# Patient Record
Sex: Female | Born: 1960 | Race: Black or African American | Hispanic: No | Marital: Single | Smoking: Never smoker
Health system: Southern US, Community
[De-identification: ages and names within clinical notes are randomized; demographics above are authoritative.]

## PROBLEM LIST (undated history)

## (undated) DIAGNOSIS — I509 Heart failure, unspecified: Secondary | ICD-10-CM

## (undated) DIAGNOSIS — K219 Gastro-esophageal reflux disease without esophagitis: Secondary | ICD-10-CM

## (undated) DIAGNOSIS — F259 Schizoaffective disorder, unspecified: Secondary | ICD-10-CM

## (undated) DIAGNOSIS — M069 Rheumatoid arthritis, unspecified: Secondary | ICD-10-CM

## (undated) DIAGNOSIS — I10 Essential (primary) hypertension: Secondary | ICD-10-CM

## (undated) DIAGNOSIS — J449 Chronic obstructive pulmonary disease, unspecified: Secondary | ICD-10-CM

## (undated) DIAGNOSIS — M109 Gout, unspecified: Secondary | ICD-10-CM

## (undated) DIAGNOSIS — E785 Hyperlipidemia, unspecified: Secondary | ICD-10-CM

## (undated) DIAGNOSIS — E119 Type 2 diabetes mellitus without complications: Secondary | ICD-10-CM

---

## 2006-08-11 ENCOUNTER — Emergency Department: Payer: Self-pay

## 2006-11-25 ENCOUNTER — Emergency Department: Payer: Self-pay | Admitting: General Practice

## 2006-12-12 ENCOUNTER — Ambulatory Visit: Payer: Self-pay | Admitting: Orthopedic Surgery

## 2006-12-19 ENCOUNTER — Ambulatory Visit: Payer: Self-pay | Admitting: Orthopedic Surgery

## 2007-04-03 ENCOUNTER — Ambulatory Visit: Payer: Self-pay | Admitting: Internal Medicine

## 2007-11-27 ENCOUNTER — Emergency Department: Payer: Self-pay | Admitting: Emergency Medicine

## 2008-05-24 ENCOUNTER — Inpatient Hospital Stay: Payer: Self-pay | Admitting: Psychiatry

## 2009-03-10 ENCOUNTER — Ambulatory Visit: Payer: Self-pay | Admitting: Internal Medicine

## 2009-03-27 ENCOUNTER — Ambulatory Visit: Payer: Self-pay | Admitting: Internal Medicine

## 2009-05-12 ENCOUNTER — Ambulatory Visit: Payer: Self-pay | Admitting: Endocrinology

## 2009-05-22 ENCOUNTER — Ambulatory Visit: Payer: Self-pay | Admitting: Internal Medicine

## 2009-07-29 ENCOUNTER — Ambulatory Visit: Payer: Self-pay | Admitting: Internal Medicine

## 2010-09-17 IMAGING — CR DG CHEST 1V PORT
1 series · 1 of 1 positions shown · non-contrast
Comparison: none

REASON FOR EXAM: Post Bronch
COMMENTS:

PROCEDURE:     DXR - DXR PORTABLE CHEST SINGLE VIEW  - May 22, 2009  [DATE]
RESULT:     Comparison is made to the study of 03/10/2009.
There is shallow inspiration. Cardiac monitoring electrodes are present. No
pneumothorax or pneumomediastinum is appreciated.

[view not recorded]
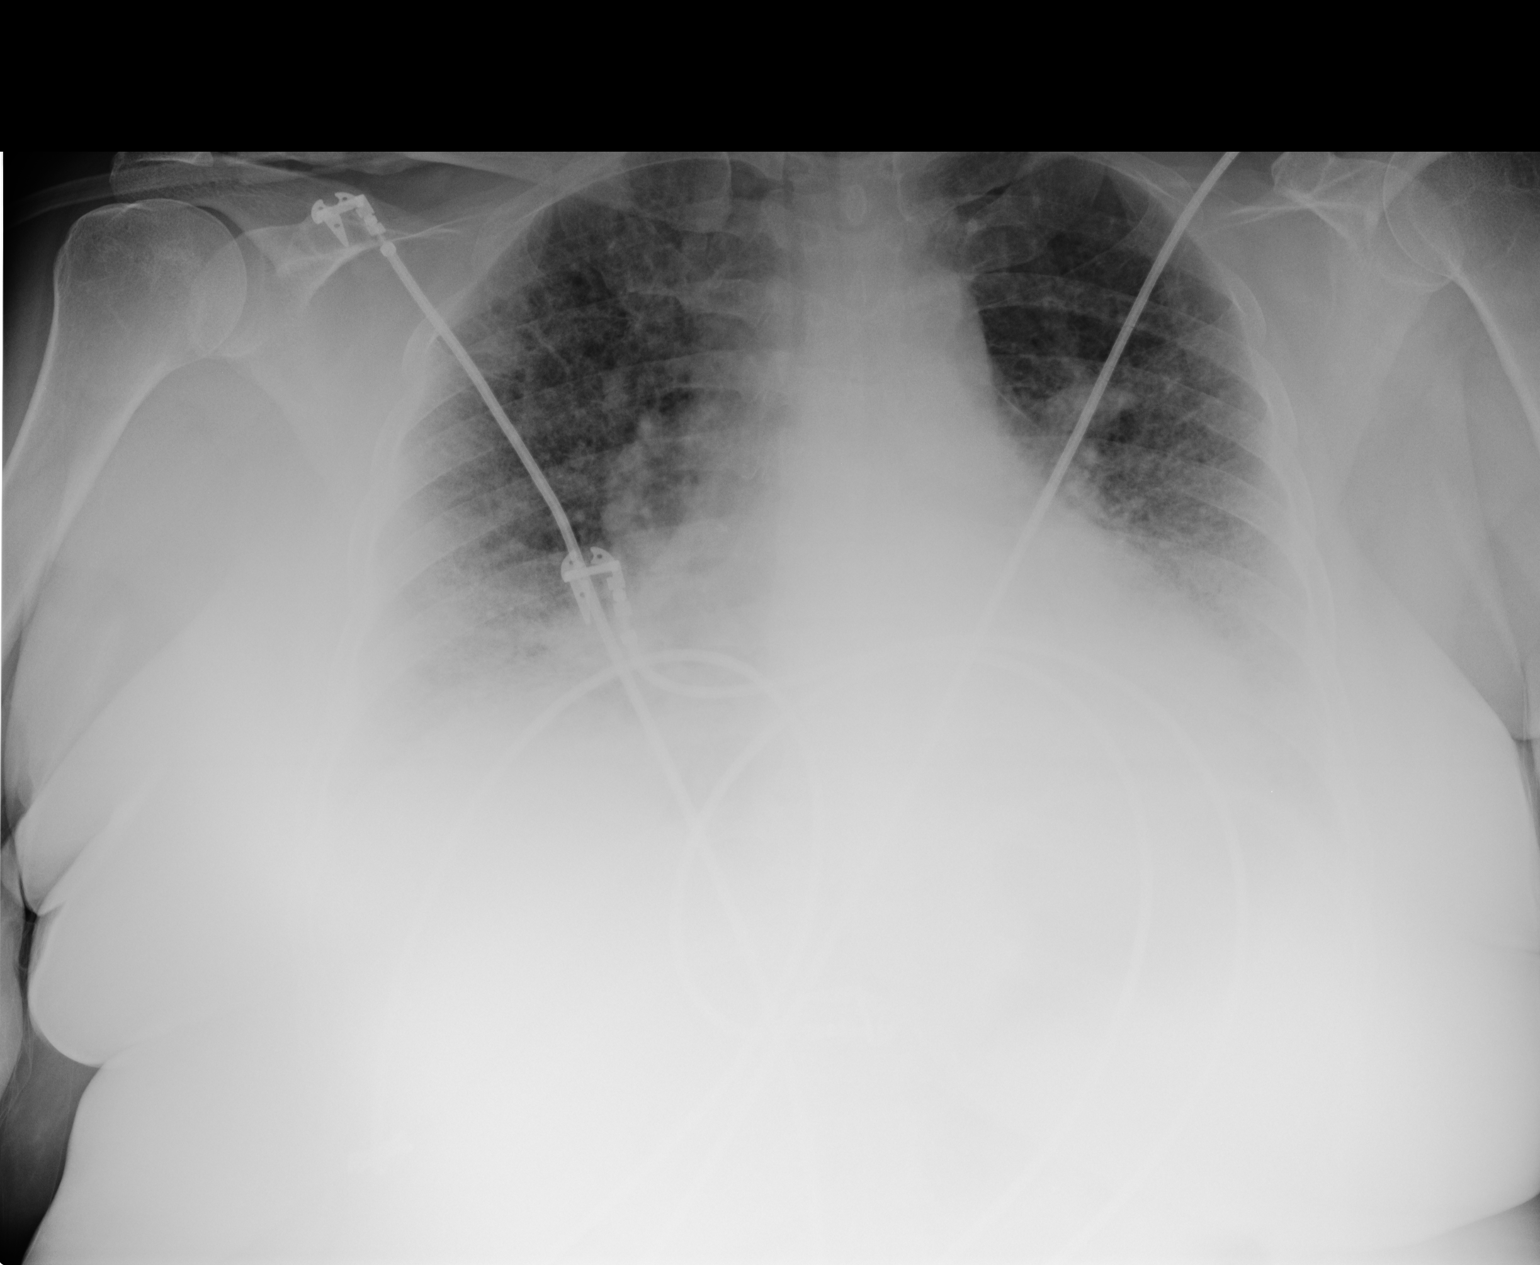

[1 of 1 positions shown; findings below may reference images not displayed]

IMPRESSION: No pneumothorax evident.

## 2010-10-06 ENCOUNTER — Ambulatory Visit: Payer: Self-pay | Admitting: Nephrology

## 2010-10-28 ENCOUNTER — Ambulatory Visit: Payer: Self-pay | Admitting: Internal Medicine

## 2010-11-15 ENCOUNTER — Ambulatory Visit: Payer: Self-pay | Admitting: Internal Medicine

## 2010-12-23 ENCOUNTER — Ambulatory Visit: Payer: Self-pay | Admitting: Internal Medicine

## 2011-04-22 ENCOUNTER — Ambulatory Visit: Payer: Self-pay

## 2012-03-12 IMAGING — CT NM PET TUM IMG LTD AREA
1 of 5 series · 8 of 25 positions shown · non-contrast
Comparison: none

REASON FOR EXAM: significant hypoxia density left lung nodule prev CT
bilateral lower lobe t...
COMMENTS:

PROCEDURE:     PET - PET/CT DX LUNG CA  - November 15, 2010  [DATE]
RESULT:
HISTORY: Hypoxia. Left lung nodule.

[Series 3: ct wb 3.0 b30f · axial · 3.0mm · 0.98mm/px · z∈[-1314,-640]mm · 8 of 435 slices shown]
[im 49/435  soft-tissue]
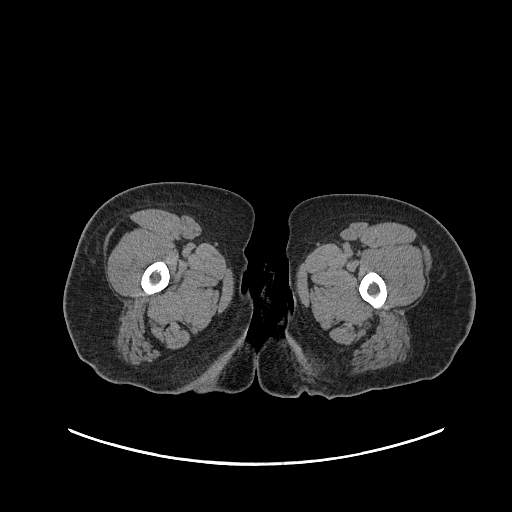
[im 97/435  soft-tissue]
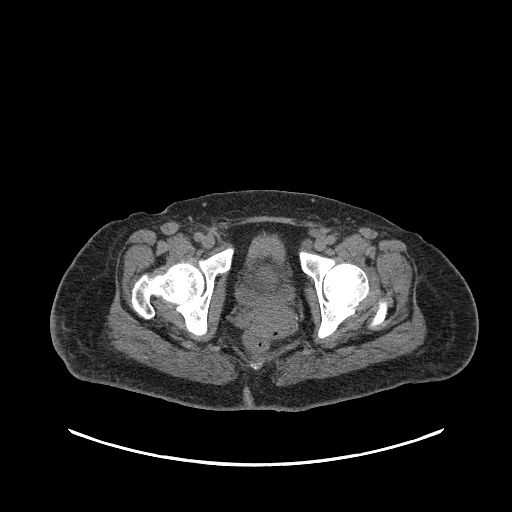
[im 145/435  soft-tissue]
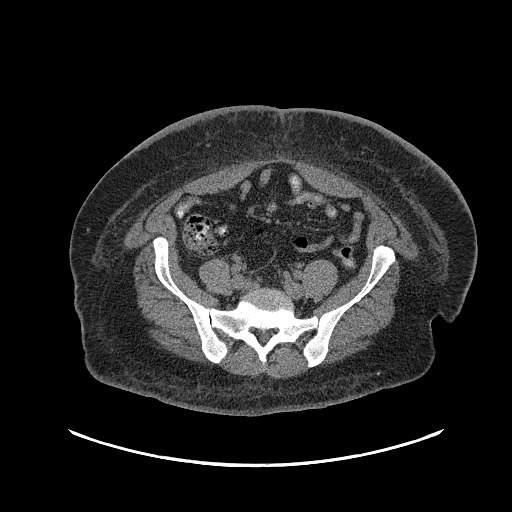
[im 193/435  soft-tissue]
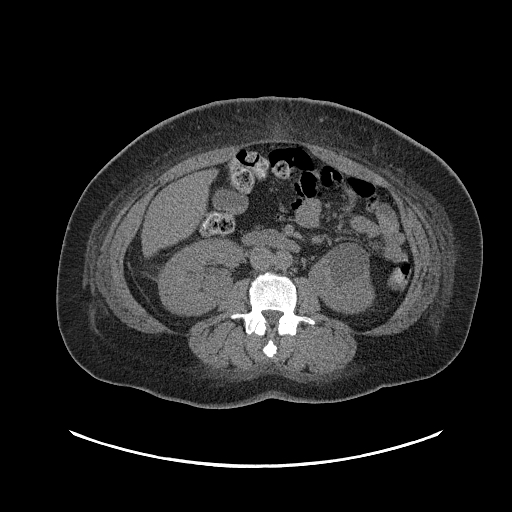
[im 242/435  soft-tissue]
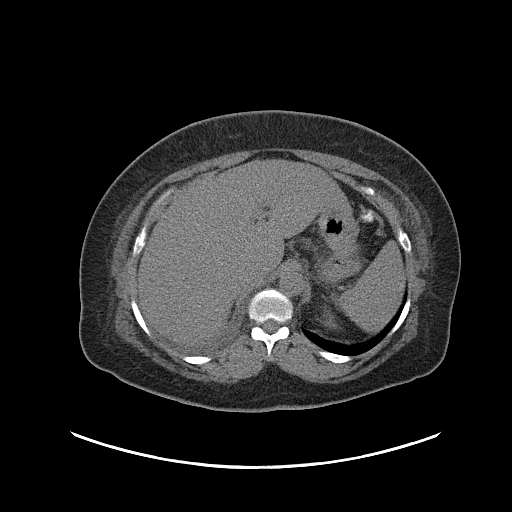
[im 290/435  soft-tissue]
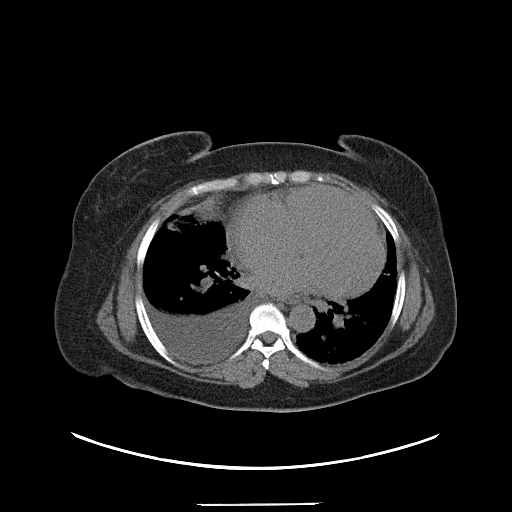
[im 338/435  soft-tissue]
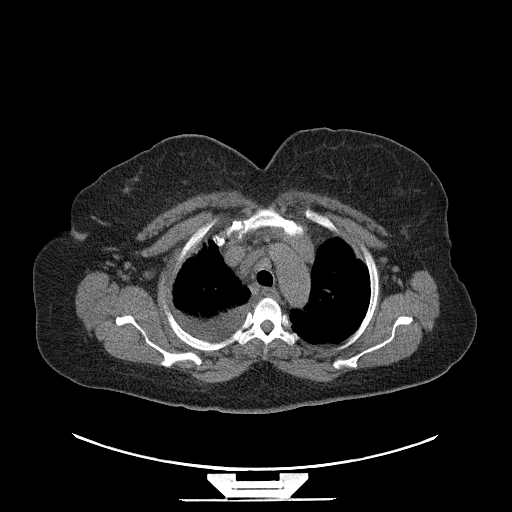
[im 386/435  soft-tissue]
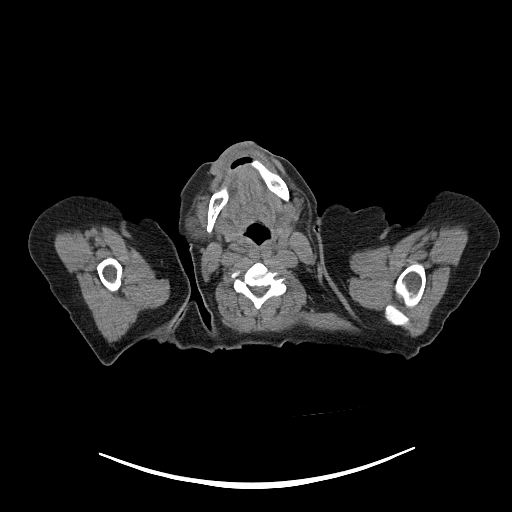

[8 of 25 positions shown; findings below may reference images not displayed]

FINDINGS: Following determination of a fasting blood sugar of 135 mg/dl,
12.82 mCi of F-18 FDG was administered. No significant PET positive
abnormalities are identified. CT was obtained for attenuation, correction
and fusion. The lungs are PET-negative. Noted in the symphysis pubis is mild
activity. This could be from inflammatory arthropathy in this region. No
bony erosion or focal bony lesion is identified.
IMPRESSION: No significant abnormality identified.

## 2014-06-17 ENCOUNTER — Ambulatory Visit: Payer: Self-pay

## 2015-02-13 ENCOUNTER — Telehealth: Payer: Self-pay | Admitting: Gastroenterology

## 2015-02-13 NOTE — Telephone Encounter (Signed)
Received Gi referral pt needs office appt for anemia, phoned patient Left message to contact office

## 2015-02-24 ENCOUNTER — Telehealth: Payer: Self-pay | Admitting: Gastroenterology

## 2015-02-24 NOTE — Telephone Encounter (Deleted)
Gastroenterology Pre-Procedure Review  Request Date: *** Requesting Physician: Dr. Marland Kitchen  PATIENT REVIEW QUESTIONS: The patient responded to the following health history questions as indicated:    1. Are you having any GI issues? {Yes/No:19989} 2. Do you have a personal history of Polyps? {Yes/No:19989} 3. Do you have a family history of Colon Cancer or Polyps? {Yes/No:19989} 4. Diabetes Mellitus? {Yes/No:19989} 5. Joint replacements in the past 12 months?{Yes/No:19989} 6. Major health problems in the past 3 months?{Yes/No:19989} 7. Any artificial heart valves, MVP, or defibrillator?{Yes/No:19989}    MEDICATIONS & ALLERGIES:    Patient reports the following regarding taking any anticoagulation/antiplatelet therapy:   Plavix, Coumadin, Eliquis, Xarelto, Lovenox, Pradaxa, Brilinta, or Effient? {Yes/No:19989} Aspirin? {Yes/No:19989}  Patient confirms/reports the following medications:  No current outpatient prescriptions on file.   No current facility-administered medications for this visit.    Patient confirms/reports the following allergies:  Allergies not on file  No orders of the defined types were placed in this encounter.    AUTHORIZATION INFORMATION Primary Insurance: 1D#: Group #:  Secondary Insurance: 1D#: Group #:  SCHEDULE INFORMATION: Date:  Time: Location:

## 2015-02-24 NOTE — Telephone Encounter (Signed)
Please call for colon triage

## 2015-04-14 NOTE — Telephone Encounter (Signed)
Error

## 2015-07-01 ENCOUNTER — Other Ambulatory Visit: Payer: Self-pay | Admitting: Nurse Practitioner

## 2015-07-01 DIAGNOSIS — IMO0001 Reserved for inherently not codable concepts without codable children: Secondary | ICD-10-CM

## 2015-07-01 DIAGNOSIS — K219 Gastro-esophageal reflux disease without esophagitis: Principal | ICD-10-CM

## 2015-07-13 ENCOUNTER — Ambulatory Visit
Admission: RE | Admit: 2015-07-13 | Discharge: 2015-07-13 | Disposition: A | Discharge status: Home / Self Care | Payer: Medicaid Other | Source: Ambulatory Visit | Attending: Nurse Practitioner | Admitting: Nurse Practitioner

## 2015-07-13 DIAGNOSIS — IMO0001 Reserved for inherently not codable concepts without codable children: Secondary | ICD-10-CM

## 2015-07-13 DIAGNOSIS — K219 Gastro-esophageal reflux disease without esophagitis: Secondary | ICD-10-CM | POA: Diagnosis present

## 2015-10-15 ENCOUNTER — Ambulatory Visit: Admission: RE | Admit: 2015-10-15 | Payer: Medicaid Other | Source: Ambulatory Visit | Admitting: Gastroenterology

## 2015-10-15 ENCOUNTER — Encounter: Admission: RE | Payer: Self-pay | Source: Ambulatory Visit

## 2015-10-15 SURGERY — COLONOSCOPY WITH PROPOFOL
Anesthesia: General

## 2015-12-24 ENCOUNTER — Emergency Department: Payer: Medicaid Other

## 2015-12-24 ENCOUNTER — Encounter: Payer: Self-pay | Admitting: Emergency Medicine

## 2015-12-24 ENCOUNTER — Inpatient Hospital Stay
Admission: EM | Admit: 2015-12-24 | Discharge: 2016-01-23 | DRG: 871 | Disposition: E | Payer: Medicaid Other | Attending: Internal Medicine | Admitting: Internal Medicine

## 2015-12-24 DIAGNOSIS — F17210 Nicotine dependence, cigarettes, uncomplicated: Secondary | ICD-10-CM | POA: Diagnosis present

## 2015-12-24 DIAGNOSIS — E872 Acidosis: Secondary | ICD-10-CM | POA: Diagnosis present

## 2015-12-24 DIAGNOSIS — J440 Chronic obstructive pulmonary disease with acute lower respiratory infection: Secondary | ICD-10-CM | POA: Diagnosis present

## 2015-12-24 DIAGNOSIS — J189 Pneumonia, unspecified organism: Secondary | ICD-10-CM | POA: Diagnosis present

## 2015-12-24 DIAGNOSIS — M109 Gout, unspecified: Secondary | ICD-10-CM | POA: Diagnosis present

## 2015-12-24 DIAGNOSIS — M069 Rheumatoid arthritis, unspecified: Secondary | ICD-10-CM | POA: Diagnosis present

## 2015-12-24 DIAGNOSIS — J9600 Acute respiratory failure, unspecified whether with hypoxia or hypercapnia: Secondary | ICD-10-CM

## 2015-12-24 DIAGNOSIS — E890 Postprocedural hypothyroidism: Secondary | ICD-10-CM | POA: Diagnosis present

## 2015-12-24 DIAGNOSIS — N179 Acute kidney failure, unspecified: Secondary | ICD-10-CM | POA: Diagnosis present

## 2015-12-24 DIAGNOSIS — A419 Sepsis, unspecified organism: Secondary | ICD-10-CM | POA: Diagnosis present

## 2015-12-24 DIAGNOSIS — I429 Cardiomyopathy, unspecified: Secondary | ICD-10-CM | POA: Diagnosis present

## 2015-12-24 DIAGNOSIS — I959 Hypotension, unspecified: Secondary | ICD-10-CM | POA: Diagnosis present

## 2015-12-24 DIAGNOSIS — Z7952 Long term (current) use of systemic steroids: Secondary | ICD-10-CM

## 2015-12-24 DIAGNOSIS — Z7983 Long term (current) use of bisphosphonates: Secondary | ICD-10-CM

## 2015-12-24 DIAGNOSIS — F259 Schizoaffective disorder, unspecified: Secondary | ICD-10-CM | POA: Diagnosis present

## 2015-12-24 DIAGNOSIS — Z791 Long term (current) use of non-steroidal anti-inflammatories (NSAID): Secondary | ICD-10-CM

## 2015-12-24 DIAGNOSIS — Z79899 Other long term (current) drug therapy: Secondary | ICD-10-CM

## 2015-12-24 DIAGNOSIS — E1122 Type 2 diabetes mellitus with diabetic chronic kidney disease: Secondary | ICD-10-CM | POA: Diagnosis present

## 2015-12-24 DIAGNOSIS — J9620 Acute and chronic respiratory failure, unspecified whether with hypoxia or hypercapnia: Secondary | ICD-10-CM

## 2015-12-24 DIAGNOSIS — H409 Unspecified glaucoma: Secondary | ICD-10-CM | POA: Diagnosis present

## 2015-12-24 DIAGNOSIS — K219 Gastro-esophageal reflux disease without esophagitis: Secondary | ICD-10-CM | POA: Diagnosis present

## 2015-12-24 DIAGNOSIS — E46 Unspecified protein-calorie malnutrition: Secondary | ICD-10-CM | POA: Diagnosis present

## 2015-12-24 DIAGNOSIS — Z66 Do not resuscitate: Secondary | ICD-10-CM | POA: Diagnosis not present

## 2015-12-24 DIAGNOSIS — C3482 Malignant neoplasm of overlapping sites of left bronchus and lung: Secondary | ICD-10-CM | POA: Diagnosis present

## 2015-12-24 DIAGNOSIS — J449 Chronic obstructive pulmonary disease, unspecified: Secondary | ICD-10-CM | POA: Diagnosis not present

## 2015-12-24 DIAGNOSIS — Z833 Family history of diabetes mellitus: Secondary | ICD-10-CM | POA: Diagnosis not present

## 2015-12-24 DIAGNOSIS — R918 Other nonspecific abnormal finding of lung field: Secondary | ICD-10-CM | POA: Diagnosis not present

## 2015-12-24 DIAGNOSIS — Z515 Encounter for palliative care: Secondary | ICD-10-CM | POA: Diagnosis not present

## 2015-12-24 DIAGNOSIS — I272 Other secondary pulmonary hypertension: Secondary | ICD-10-CM | POA: Diagnosis present

## 2015-12-24 DIAGNOSIS — E875 Hyperkalemia: Secondary | ICD-10-CM | POA: Diagnosis present

## 2015-12-24 DIAGNOSIS — J9621 Acute and chronic respiratory failure with hypoxia: Secondary | ICD-10-CM | POA: Diagnosis present

## 2015-12-24 DIAGNOSIS — J9622 Acute and chronic respiratory failure with hypercapnia: Secondary | ICD-10-CM | POA: Diagnosis present

## 2015-12-24 DIAGNOSIS — C3492 Malignant neoplasm of unspecified part of left bronchus or lung: Secondary | ICD-10-CM | POA: Diagnosis not present

## 2015-12-24 DIAGNOSIS — Z9981 Dependence on supplemental oxygen: Secondary | ICD-10-CM

## 2015-12-24 DIAGNOSIS — E785 Hyperlipidemia, unspecified: Secondary | ICD-10-CM | POA: Diagnosis present

## 2015-12-24 DIAGNOSIS — I509 Heart failure, unspecified: Secondary | ICD-10-CM | POA: Diagnosis present

## 2015-12-24 DIAGNOSIS — I130 Hypertensive heart and chronic kidney disease with heart failure and stage 1 through stage 4 chronic kidney disease, or unspecified chronic kidney disease: Secondary | ICD-10-CM | POA: Diagnosis present

## 2015-12-24 DIAGNOSIS — N390 Urinary tract infection, site not specified: Secondary | ICD-10-CM | POA: Diagnosis present

## 2015-12-24 DIAGNOSIS — N189 Chronic kidney disease, unspecified: Secondary | ICD-10-CM | POA: Diagnosis present

## 2015-12-24 HISTORY — DX: Rheumatoid arthritis, unspecified: M06.9

## 2015-12-24 HISTORY — DX: Essential (primary) hypertension: I10

## 2015-12-24 HISTORY — DX: Hyperlipidemia, unspecified: E78.5

## 2015-12-24 HISTORY — DX: Schizoaffective disorder, unspecified: F25.9

## 2015-12-24 HISTORY — DX: Heart failure, unspecified: I50.9

## 2015-12-24 HISTORY — DX: Gastro-esophageal reflux disease without esophagitis: K21.9

## 2015-12-24 HISTORY — DX: Gout, unspecified: M10.9

## 2015-12-24 HISTORY — DX: Type 2 diabetes mellitus without complications: E11.9

## 2015-12-24 HISTORY — DX: Chronic obstructive pulmonary disease, unspecified: J44.9

## 2015-12-24 LAB — URINALYSIS COMPLETE WITH MICROSCOPIC (ARMC ONLY)
Glucose, UA: 50 mg/dL — AB
KETONES UR: NEGATIVE mg/dL
Nitrite: NEGATIVE
PROTEIN: 100 mg/dL — AB
Specific Gravity, Urine: 1.02 (ref 1.005–1.030)
pH: 5 (ref 5.0–8.0)

## 2015-12-24 LAB — CBC
HCT: 29.8 % — ABNORMAL LOW (ref 35.0–47.0)
HEMOGLOBIN: 9.1 g/dL — AB (ref 12.0–16.0)
MCH: 26.9 pg (ref 26.0–34.0)
MCHC: 30.4 g/dL — AB (ref 32.0–36.0)
MCV: 88.4 fL (ref 80.0–100.0)
PLATELETS: 506 10*3/uL — AB (ref 150–440)
RBC: 3.37 MIL/uL — ABNORMAL LOW (ref 3.80–5.20)
RDW: 17.9 % — ABNORMAL HIGH (ref 11.5–14.5)
WBC: 13.9 10*3/uL — ABNORMAL HIGH (ref 3.6–11.0)

## 2015-12-24 LAB — COMPREHENSIVE METABOLIC PANEL
ALK PHOS: 86 U/L (ref 38–126)
ALT: 10 U/L — ABNORMAL LOW (ref 14–54)
ANION GAP: 24 — AB (ref 5–15)
AST: 45 U/L — ABNORMAL HIGH (ref 15–41)
Albumin: 2.4 g/dL — ABNORMAL LOW (ref 3.5–5.0)
BUN: 15 mg/dL (ref 6–20)
CALCIUM: 11.4 mg/dL — AB (ref 8.9–10.3)
CO2: 15 mmol/L — AB (ref 22–32)
Chloride: 96 mmol/L — ABNORMAL LOW (ref 101–111)
Creatinine, Ser: 1.15 mg/dL — ABNORMAL HIGH (ref 0.44–1.00)
GFR calc non Af Amer: 53 mL/min — ABNORMAL LOW (ref >60)
Glucose, Bld: 98 mg/dL (ref 65–99)
Potassium: 6.3 mmol/L — ABNORMAL HIGH (ref 3.5–5.1)
SODIUM: 135 mmol/L (ref 135–145)
Total Bilirubin: 0.6 mg/dL (ref 0.3–1.2)
Total Protein: 6.9 g/dL (ref 6.5–8.1)

## 2015-12-24 LAB — BRAIN NATRIURETIC PEPTIDE: B NATRIURETIC PEPTIDE 5: 543 pg/mL — AB (ref 0.0–100.0)

## 2015-12-24 LAB — LACTIC ACID, PLASMA
LACTIC ACID, VENOUS: 10 mmol/L — AB (ref 0.5–2.0)
LACTIC ACID, VENOUS: 5.8 mmol/L — AB (ref 0.5–2.0)

## 2015-12-24 LAB — POTASSIUM: Potassium: 4.5 mmol/L (ref 3.5–5.1)

## 2015-12-24 LAB — GLUCOSE, CAPILLARY
GLUCOSE-CAPILLARY: 133 mg/dL — AB (ref 65–99)
Glucose-Capillary: 163 mg/dL — ABNORMAL HIGH (ref 65–99)

## 2015-12-24 LAB — MRSA PCR SCREENING: MRSA BY PCR: NEGATIVE

## 2015-12-24 LAB — TROPONIN I: TROPONIN I: 0.04 ng/mL — AB (ref <0.031)

## 2015-12-24 MED ORDER — ACETAMINOPHEN 325 MG PO TABS: 325.0000 mg | ORAL_TABLET | Freq: Four times a day (QID) | ORAL | Status: DC | PRN

## 2015-12-24 MED ORDER — BOSENTAN 125 MG PO TABS
125.0000 mg | ORAL_TABLET | Freq: Two times a day (BID) | ORAL | Status: DC
  Administered 2015-12-25: 125 mg via ORAL
  Filled 2015-12-24 (×3): qty 1

## 2015-12-24 MED ORDER — CALCIUM CARBONATE-VITAMIN D 500-200 MG-UNIT PO TABS
1.0000 | ORAL_TABLET | Freq: Two times a day (BID) | ORAL | Status: DC
  Administered 2015-12-25: 1 via ORAL
  Filled 2015-12-24: qty 1

## 2015-12-24 MED ORDER — IPRATROPIUM-ALBUTEROL 0.5-2.5 (3) MG/3ML IN SOLN
3.0000 mL | Freq: Once | RESPIRATORY_TRACT | Status: AC
  Administered 2015-12-24: 3 mL via RESPIRATORY_TRACT
  Filled 2015-12-24: qty 3

## 2015-12-24 MED ORDER — HEPARIN SODIUM (PORCINE) 5000 UNIT/ML IJ SOLN
5000.0000 [IU] | Freq: Three times a day (TID) | INTRAMUSCULAR | Status: DC
  Administered 2015-12-24 – 2015-12-25 (×3): 5000 [IU] via SUBCUTANEOUS
  Filled 2015-12-24 (×3): qty 1

## 2015-12-24 MED ORDER — SODIUM CHLORIDE 0.9 % IV SOLN
INTRAVENOUS | Status: DC
  Administered 2015-12-24: 18:00:00 via INTRAVENOUS

## 2015-12-24 MED ORDER — IOPAMIDOL (ISOVUE-300) INJECTION 61%
75.0000 mL | Freq: Once | INTRAVENOUS | Status: AC | PRN
  Administered 2015-12-24: 75 mL via INTRAVENOUS

## 2015-12-24 MED ORDER — FERROUS SULFATE 325 (65 FE) MG PO TABS
325.0000 mg | ORAL_TABLET | Freq: Every day | ORAL | Status: DC
  Administered 2015-12-25: 325 mg via ORAL
  Filled 2015-12-24: qty 1

## 2015-12-24 MED ORDER — PIPERACILLIN-TAZOBACTAM 3.375 G IVPB 30 MIN
3.3750 g | Freq: Once | INTRAVENOUS | Status: AC
  Administered 2015-12-24: 3.375 g via INTRAVENOUS
  Filled 2015-12-24: qty 50

## 2015-12-24 MED ORDER — SODIUM CHLORIDE 0.9 % IV SOLN
1.0000 g | Freq: Once | INTRAVENOUS | Status: AC
  Administered 2015-12-24: 1 g via INTRAVENOUS
  Filled 2015-12-24: qty 10

## 2015-12-24 MED ORDER — SODIUM POLYSTYRENE SULFONATE 15 GM/60ML PO SUSP
15.0000 g | Freq: Once | ORAL | Status: AC
  Administered 2015-12-24: 15 g via ORAL
  Filled 2015-12-24: qty 60

## 2015-12-24 MED ORDER — PREDNISONE 2.5 MG PO TABS
2.5000 mg | ORAL_TABLET | Freq: Every day | ORAL | Status: DC
  Administered 2015-12-25: 2.5 mg via ORAL
  Filled 2015-12-24: qty 1

## 2015-12-24 MED ORDER — COLCHICINE 0.6 MG PO TABS: 0.6000 mg | ORAL_TABLET | Freq: Every day | ORAL | Status: DC | PRN

## 2015-12-24 MED ORDER — ARIPIPRAZOLE 5 MG PO TABS: 10.0000 mg | ORAL_TABLET | Freq: Every day | ORAL | Status: DC

## 2015-12-24 MED ORDER — PIPERACILLIN-TAZOBACTAM 3.375 G IVPB
3.3750 g | Freq: Three times a day (TID) | INTRAVENOUS | Status: DC
  Administered 2015-12-24 – 2015-12-25 (×3): 3.375 g via INTRAVENOUS
  Filled 2015-12-24 (×7): qty 50

## 2015-12-24 MED ORDER — ALENDRONATE SODIUM 70 MG PO TABS: 70.0000 mg | ORAL_TABLET | ORAL | Status: DC

## 2015-12-24 MED ORDER — DARIFENACIN HYDROBROMIDE ER 7.5 MG PO TB24
7.5000 mg | ORAL_TABLET | Freq: Every day | ORAL | Status: DC
  Administered 2015-12-25: 7.5 mg via ORAL
  Filled 2015-12-24: qty 1

## 2015-12-24 MED ORDER — SODIUM CHLORIDE 0.9 % IV BOLUS (SEPSIS)
1000.0000 mL | Freq: Once | INTRAVENOUS | Status: AC
  Administered 2015-12-24: 1000 mL via INTRAVENOUS

## 2015-12-24 MED ORDER — HYDROXYCHLOROQUINE SULFATE 200 MG PO TABS
400.0000 mg | ORAL_TABLET | Freq: Every day | ORAL | Status: DC
  Administered 2015-12-25: 400 mg via ORAL
  Filled 2015-12-24: qty 2

## 2015-12-24 MED ORDER — TIOTROPIUM BROMIDE MONOHYDRATE 18 MCG IN CAPS
18.0000 ug | ORAL_CAPSULE | Freq: Every day | RESPIRATORY_TRACT | Status: DC
  Administered 2015-12-25: 18 ug via RESPIRATORY_TRACT
  Filled 2015-12-24: qty 5

## 2015-12-24 MED ORDER — SODIUM POLYSTYRENE SULFONATE 15 GM/60ML PO SUSP
30.0000 g | Freq: Once | ORAL | Status: AC
  Administered 2015-12-24: 30 g via ORAL
  Filled 2015-12-24: qty 120

## 2015-12-24 MED ORDER — VANCOMYCIN HCL 500 MG IV SOLR
500.0000 mg | Freq: Two times a day (BID) | INTRAVENOUS | Status: DC
Start: 2015-12-25 — End: 2015-12-25
  Administered 2015-12-25: 500 mg via INTRAVENOUS
  Filled 2015-12-24 (×4): qty 500

## 2015-12-24 MED ORDER — CLONAZEPAM 0.5 MG PO TABS: 0.5000 mg | ORAL_TABLET | Freq: Every day | ORAL | Status: DC

## 2015-12-24 MED ORDER — LEVOTHYROXINE SODIUM 100 MCG PO TABS
100.0000 ug | ORAL_TABLET | Freq: Every day | ORAL | Status: DC
  Administered 2015-12-25: 100 ug via ORAL
  Filled 2015-12-24: qty 1

## 2015-12-24 MED ORDER — ALBUTEROL SULFATE (2.5 MG/3ML) 0.083% IN NEBU: 2.5000 mg | INHALATION_SOLUTION | Freq: Four times a day (QID) | RESPIRATORY_TRACT | Status: DC | PRN

## 2015-12-24 MED ORDER — MOMETASONE FURO-FORMOTEROL FUM 100-5 MCG/ACT IN AERO
2.0000 | INHALATION_SPRAY | Freq: Two times a day (BID) | RESPIRATORY_TRACT | Status: DC
  Administered 2015-12-24 – 2015-12-26 (×3): 2 via RESPIRATORY_TRACT
  Filled 2015-12-24: qty 8.8

## 2015-12-24 MED ORDER — IPRATROPIUM-ALBUTEROL 0.5-2.5 (3) MG/3ML IN SOLN
3.0000 mL | RESPIRATORY_TRACT | Status: DC
  Administered 2015-12-24 – 2015-12-25 (×6): 3 mL via RESPIRATORY_TRACT
  Filled 2015-12-24 (×6): qty 3

## 2015-12-24 MED ORDER — VANCOMYCIN HCL IN DEXTROSE 1-5 GM/200ML-% IV SOLN
1000.0000 mg | Freq: Once | INTRAVENOUS | Status: AC
  Administered 2015-12-24: 1000 mg via INTRAVENOUS
  Filled 2015-12-24: qty 200

## 2015-12-24 MED ORDER — FOLIC ACID 1 MG PO TABS
1.0000 mg | ORAL_TABLET | Freq: Every day | ORAL | Status: DC
  Administered 2015-12-25: 1 mg via ORAL
  Filled 2015-12-24: qty 1

## 2015-12-24 MED ORDER — CITALOPRAM HYDROBROMIDE 20 MG PO TABS: 20.0000 mg | ORAL_TABLET | Freq: Every day | ORAL | Status: DC

## 2015-12-24 MED ORDER — PANTOPRAZOLE SODIUM 40 MG PO TBEC
40.0000 mg | DELAYED_RELEASE_TABLET | Freq: Every day | ORAL | Status: DC
Start: 2015-12-24 — End: 2015-12-27
  Administered 2015-12-25: 40 mg via ORAL
  Filled 2015-12-24: qty 1

## 2015-12-24 NOTE — ED Notes (Signed)
Patient placed on 100% NRB.  sats improved to 94%  Work of breathing decreased.

## 2015-12-24 NOTE — ED Provider Notes (Signed)
[facility] Emergency Department Provider Note  ____________________________________________    I have reviewed the triage vital signs and the nursing notes.   HISTORY  Chief Complaint Loss of Consciousness    HPI Beth Hunter is a 55 y.o. female who presents with shortness of breath. Patient is a resident at [facility]. She has not been feeling well for a week. she is on home oxygen for COPD. Apparently she had a syncopal episode today. Patient denies chest pain. She reports cough and shortness of breath. Subjective fevers. No diaphoresis. No Calf pain or leg swelling     Past Medical History  Diagnosis Date  . Hypertension   . Diabetes mellitus without complication ([place])   . COPD (chronic obstructive pulmonary disease) ([place])   . GERD (gastroesophageal reflux disease)   . Schizoaffective disorder ([place])   . Rheumatoid arthritis ([place])   . Gout   . Hyperlipidemia     There are no active problems to display for this patient.   No past surgical history on file.  Current Outpatient Rx  Name  Route  Sig  Dispense  Refill  . acetaminophen (TYLENOL) 325 MG tablet   Oral   Take 325 mg by mouth every 6 (six) hours as needed for moderate pain or fever.         Marland Kitchen alendronate (FOSAMAX) 70 MG tablet   Oral   Take 70 mg by mouth once a week. Take with a full glass of water on an empty stomach.         . ARIPiprazole (ABILIFY) 10 MG tablet   Oral   Take 10 mg by mouth at bedtime.         Marland Kitchen bosentan (TRACLEER) 125 MG tablet   Oral   Take 125 mg by mouth 2 (two) times daily.         . calcium carbonate (TUMS - DOSED IN MG ELEMENTAL CALCIUM) 500 MG chewable tablet   Oral   Chew 2 tablets by mouth every 8 (eight) hours as needed for indigestion or heartburn.         . Calcium Carbonate-Vitamin D (CALCIUM 600+D) 600-400 MG-UNIT tablet   Oral   Take 1 tablet by mouth 2 (two) times daily.         . Cholecalciferol  (VITAMIN D-3 PO)   Oral   Take 600 Units by mouth daily.         . clonazePAM (KLONOPIN) 0.5 MG tablet   Oral   Take 0.5 mg by mouth at bedtime.         . colchicine 0.6 MG tablet   Oral   Take 0.6 mg by mouth daily as needed (gout).         . ferrous sulfate 325 (65 FE) MG tablet   Oral   Take 325 mg by mouth daily.         . Fluticasone-Salmeterol (ADVAIR) 100-50 MCG/DOSE AEPB   Inhalation   Inhale 1 puff into the lungs 2 (two) times daily.         . furosemide (LASIX) 20 MG tablet   Oral   Take 10 mg by mouth daily.         . hydroxychloroquine (PLAQUENIL) 200 MG tablet   Oral   Take 400 mg by mouth daily.         Marland Kitchen levothyroxine (SYNTHROID, LEVOTHROID) 100 MCG tablet   Oral   Take 100 mcg by mouth daily.         Marland Kitchen  lisinopril (PRINIVIL,ZESTRIL) 40 MG tablet   Oral   Take 40 mg by mouth daily.         . meloxicam (MOBIC) 15 MG tablet   Oral   Take 15 mg by mouth daily.         Marland Kitchen omeprazole (PRILOSEC) 40 MG capsule   Oral   Take 40 mg by mouth 2 (two) times daily.         . predniSONE (DELTASONE) 2.5 MG tablet   Oral   Take 2.5 mg by mouth daily.         . solifenacin (VESICARE) 10 MG tablet   Oral   Take 10 mg by mouth daily.         . Tadalafil, PAH, (ADCIRCA) 20 MG TABS   Oral   Take 40 mg by mouth daily.         Marland Kitchen tiotropium (SPIRIVA) 18 MCG inhalation capsule   Inhalation   Place 18 mcg into inhaler and inhale daily.           Allergies Review of patient's allergies indicates no known allergies.  No family history on file.  Social History Social History  Substance Use Topics  . Smoking status: Never Smoker   . Smokeless tobacco: Not on file  . Alcohol Use: Not on file    Review of Systems  Constitutional: Subjective fevers Eyes: Negative for redness ENT: Negative for sore throat Cardiovascular: Negative for chest pain Respiratory: As above Gastrointestinal: Negative for abdominal pain Genitourinary:  Negative for dysuria. Musculoskeletal: Negative for back pain. Skin: Negative for rash. Neurological: Negative for focal weakness Psychiatric: Moderate anxiety    ____________________________________________   PHYSICAL EXAM:  VITAL SIGNS: ED Triage Vitals  Enc Vitals Group     BP 01/20/2016 1136 97/67 mmHg     Pulse Rate 01/18/2016 1136 117     Resp 01/12/2016 1136 36     Temp 01/18/2016 1136 97.8 F (36.6 C)     Temp Source 01/12/2016 1136 Axillary     SpO2 01/13/2016 1136 77 %     Weight 01/04/2016 1136 136 lb 14.4 oz (62.097 kg)     Height 01/07/2016 1136 '5\' 1"'$  (1.549 m)     Head Cir --      Peak Flow --      Pain Score 01/01/2016 1139 0     Pain Loc --      Pain Edu? --      Excl. in [place]? --      Constitutional: Alert and oriented. Ill-appearing Eyes: Conjunctivae are normal. No erythema or injection ENT   Head: Normocephalic and atraumatic.   Mouth/Throat: Mucous membranes are moist. Cardiovascular: Tachycardia, regular rhythm. Normal and symmetric distal pulses are present in the upper extremities. No murmurs or rubs  Respiratory: Increased respiratory effort with tachypnea. Significant decreased breath sounds on the left Gastrointestinal: Soft and non-tender in all quadrants. No distention. There is no CVA tenderness. Genitourinary: deferred Musculoskeletal: Nontender with normal range of motion in all extremities. No lower extremity tenderness nor edema. Neurologic:  Normal speech and language. No gross focal neurologic deficits are appreciated. Skin:  Skin is warm, dry and intact. No rash noted.   ____________________________________________    LABS (pertinent positives/negatives)  Labs Reviewed  CBC - Abnormal; Notable for the following:    WBC 13.9 (*)    RBC 3.37 (*)    Hemoglobin 9.1 (*)    HCT 29.8 (*)    MCHC 30.4 (*)  RDW 17.9 (*)    Platelets 506 (*)    All other components within normal limits  BRAIN NATRIURETIC PEPTIDE - Abnormal; Notable for the  following:    B Natriuretic Peptide 543.0 (*)    All other components within normal limits  CULTURE, BLOOD (ROUTINE X 2)  CULTURE, BLOOD (ROUTINE X 2)  URINE CULTURE  COMPREHENSIVE METABOLIC PANEL  TROPONIN I  LACTIC ACID, PLASMA  LACTIC ACID, PLASMA  URINALYSIS COMPLETEWITH MICROSCOPIC ([facility] ONLY)    ____________________________________________   EKG  ED ECG REPORT I, Lavonia Drafts, the attending physician, personally viewed and interpreted this ECG.  Date: 01/18/2016 EKG Time: 11:19 AM Rate: 117 Rhythm: Sinus tachycardia QRS Axis: normal Intervals: normal ST/T Wave abnormalities: Nonspecific Conduction Disturbances: none    ____________________________________________    RADIOLOGY  Opacification of the left hemithorax  ____________________________________________   PROCEDURES  Procedure(s) performed: none  Critical Care performed: yes  CRITICAL CARE Performed by: Lavonia Drafts   Total critical care time: 30 minutes  Critical care time was exclusive of separately billable procedures and treating other patients.  Critical care was necessary to treat or prevent imminent or life-threatening deterioration.  Critical care was time spent personally by me on the following activities: development of treatment plan with patient and/or surrogate as well as nursing, discussions with consultants, evaluation of patient's response to treatment, examination of patient, obtaining history from patient or surrogate, ordering and performing treatments and interventions, ordering and review of laboratory studies, ordering and review of radiographic studies, pulse oximetry and re-evaluation of patient's condition.   ____________________________________________   INITIAL IMPRESSION / ASSESSMENT AND PLAN / ED COURSE  Pertinent labs & imaging results that were available during my care of the patient were reviewed by me and considered in my medical decision making (see  chart for details).  Patient presents with shortness of breath. She has a history of severe COPD on home oxygen. She is on nonrebreather at this time given hypoxia upon arrival. She is tachycardic, tachypneic. Differential includes COPD exacerbation, pneumonia, CHF, PE.  Patient with near complete opacification of the left thorax concerning for pneumonia, vancomycin and Zosyn started code sepsis called. Pending lactic acid  ----------------------------------------- 2:16 PM on 01/21/2016 -----------------------------------------  Lactic acid delayed by laboratory apparently. Patient had a brief episode of hypotension, we will start her 30 MLS per kilogram of normal saline.   ED Sepsis - Repeat Assessment   Performed at:    1417  Last Vitals:    Blood pressure 86/66, pulse 92, temperature 97.8 F (36.6 C), temperature source Axillary, resp. rate 14, height '5\' 1"'$  (1.549 m), weight 62.097 kg, SpO2 87 %.  Heart:      Normal rate  Lungs:     unchanged  Capillary Refill:   normal  Peripheral Pulse (include location): 2+ dp   Skin (include color):   pink   ____________________________________________   FINAL CLINICAL IMPRESSION(S) / ED DIAGNOSES  Final diagnoses:  Sepsis, due to unspecified organism [facility])  Community acquired pneumonia  UTI (lower urinary tract infection)          Lavonia Drafts, MD 01/07/2016 [phone]

## 2015-12-24 NOTE — ED Notes (Signed)
EDP made aware of elevated lactic acid level, pt tolerating Bi-pap well.

## 2015-12-24 NOTE — H&P (Signed)
[facility] NAME: Beth Hunter    MR#:  [id number]  DATE OF BIRTH:  03-27-1961  DATE OF ADMISSION:  01/16/2016  PRIMARY CARE PHYSICIAN: Lavera Guise, MD   REQUESTING/REFERRING PHYSICIAN: [place]  CHIEF COMPLAINT:   Chief Complaint  Patient presents with  . Loss of Consciousness    HISTORY OF PRESENT ILLNESS: Beth Hunter  is a 55 y.o. female with a known history of Hypertension, diabetes, COPD, gastroesophageal reflux disease, schizoaffective disorder, rheumatoid arthritis, hyperlipidemia, CHF- lives in a group home, has been losing weight for last 1-2 months. She was not feeling well for last 1 week gradually getting more weak and today had any syncopal or fall episode and so they sent to emergency room. She was noted to be hypoxic and on chest x-ray her the left-sided lung was completely whiteout so CT scan of chest was done which showed a huge lung mass involving the chest wall. Patient also requiring more than her usual oxygen at home. She was noted to have high potassium here. Urinalysis strongly positive. Lactic acid elevated.  PAST MEDICAL HISTORY:   Past Medical History  Diagnosis Date  . Hypertension   . Diabetes mellitus without complication ([place])   . COPD (chronic obstructive pulmonary disease) ([place])   . GERD (gastroesophageal reflux disease)   . Schizoaffective disorder ([place])   . Rheumatoid arthritis ([place])   . Gout   . Hyperlipidemia   . CHF (congestive heart failure) ([place])     PAST SURGICAL HISTORY: No past surgical history on file.  SOCIAL HISTORY:  Social History  Substance Use Topics  . Smoking status: Never Smoker   . Smokeless tobacco: Not on file  . Alcohol Use: Not on file    FAMILY HISTORY:  Family History  Problem Relation Age of Onset  . Diabetes Mother     DRUG ALLERGIES: No Known Allergies  REVIEW OF SYSTEMS:   CONSTITUTIONAL: No fever, Positive for fatigue or weakness.  EYES:  No blurred or double vision.  EARS, NOSE, AND THROAT: No tinnitus or ear pain.  RESPIRATORY: No cough, positive for shortness of breath, no wheezing or hemoptysis.  CARDIOVASCULAR: No chest pain, orthopnea, edema.  GASTROINTESTINAL: No nausea, vomiting, diarrhea or abdominal pain.  GENITOURINARY: No dysuria, hematuria.  ENDOCRINE: No polyuria, nocturia,  HEMATOLOGY: No anemia, easy bruising or bleeding SKIN: No rash or lesion. MUSCULOSKELETAL: No joint pain or arthritis.   NEUROLOGIC: No tingling, numbness, weakness.  PSYCHIATRY: No anxiety or depression.   MEDICATIONS AT HOME:  Prior to Admission medications   Medication Sig Start Date End Date Taking? Authorizing Provider  acetaminophen (TYLENOL) 325 MG tablet Take 325 mg by mouth every 6 (six) hours as needed for moderate pain or fever.   Yes Historical Provider, MD  albuterol (PROVENTIL) (2.5 MG/3ML) 0.083% nebulizer solution Take 2.5 mg by nebulization every 6 (six) hours as needed for wheezing or shortness of breath.   Yes Historical Provider, MD  alendronate (FOSAMAX) 70 MG tablet Take 70 mg by mouth once a week. Take with a full glass of water on an empty stomach.   Yes Historical Provider, MD  ARIPiprazole (ABILIFY) 10 MG tablet Take 10 mg by mouth at bedtime.   Yes Historical Provider, MD  bosentan (TRACLEER) 125 MG tablet Take 125 mg by mouth 2 (two) times daily.   Yes Historical Provider, MD  calcium carbonate (TUMS - DOSED IN MG ELEMENTAL CALCIUM) 500 MG chewable tablet Chew 2  tablets by mouth every 8 (eight) hours as needed for indigestion or heartburn.   Yes Historical Provider, MD  Calcium Carbonate-Vitamin D (CALCIUM 600+D) 600-400 MG-UNIT tablet Take 1 tablet by mouth 2 (two) times daily.   Yes Historical Provider, MD  citalopram (CELEXA) 20 MG tablet Take 20 mg by mouth at bedtime.   Yes Historical Provider, MD  clonazePAM (KLONOPIN) 0.5 MG tablet Take 0.5 mg by mouth at bedtime.   Yes Historical Provider, MD  colchicine  0.6 MG tablet Take 0.6 mg by mouth daily as needed (gout).   Yes Historical Provider, MD  esomeprazole (NEXIUM) 40 MG capsule Take 40 mg by mouth 2 (two) times daily before a meal.   Yes Historical Provider, MD  ferrous sulfate 325 (65 FE) MG tablet Take 325 mg by mouth daily.   Yes Historical Provider, MD  Fluticasone-Salmeterol (ADVAIR) 100-50 MCG/DOSE AEPB Inhale 1 puff into the lungs 2 (two) times daily.   Yes Historical Provider, MD  folic acid (FOLVITE) 1 MG tablet Take 1 mg by mouth daily.   Yes Historical Provider, MD  furosemide (LASIX) 20 MG tablet Take 10 mg by mouth daily.   Yes Historical Provider, MD  hydroxychloroquine (PLAQUENIL) 200 MG tablet Take 400 mg by mouth daily.   Yes Historical Provider, MD  levothyroxine (SYNTHROID, LEVOTHROID) 100 MCG tablet Take 100 mcg by mouth daily.   Yes Historical Provider, MD  lisinopril (PRINIVIL,ZESTRIL) 20 MG tablet Take 20 mg by mouth daily.   Yes Historical Provider, MD  meloxicam (MOBIC) 15 MG tablet Take 15 mg by mouth daily.   Yes Historical Provider, MD  polyethylene glycol (MIRALAX / GLYCOLAX) packet Take 17 g by mouth daily.   Yes Historical Provider, MD  predniSONE (DELTASONE) 2.5 MG tablet Take 2.5 mg by mouth daily.   Yes Historical Provider, MD  ranitidine (ZANTAC) 75 MG tablet Take 75 mg by mouth 2 (two) times daily.   Yes Historical Provider, MD  solifenacin (VESICARE) 10 MG tablet Take 10 mg by mouth daily.   Yes Historical Provider, MD  Tadalafil, PAH, (ADCIRCA) 20 MG TABS Take 40 mg by mouth daily.   Yes Historical Provider, MD  tiotropium (SPIRIVA) 18 MCG inhalation capsule Place 18 mcg into inhaler and inhale daily.   Yes Historical Provider, MD  traMADol (ULTRAM) 50 MG tablet Take 50 mg by mouth daily as needed for moderate pain or severe pain.   Yes Historical Provider, MD      PHYSICAL EXAMINATION:   VITAL SIGNS: Blood pressure 91/70, pulse 83, temperature 97.8 F (36.6 C), temperature source Axillary, resp. rate 21,  height '5\' 1"'$  (1.549 m), weight 62.097 kg (136 lb 14.4 oz), SpO2 97 %.  GENERAL:  55 y.o.-year-old patient lying in the bed with acute Respiratory distress. Requiring 6-8 L of oxygen  EYES: Pupils equal, round, reactive to light and accommodation. No scleral icterus. Extraocular muscles intact.  HEENT: Head atraumatic, normocephalic. Oropharynx and nasopharynx clear.  NECK:  Supple, no jugular venous distention. No thyroid enlargement, no tenderness.  LUNGS: Decreased on leftside, no wheezing, rales,rhonchi or crepitation. Positive use of accessory muscles of respiration. Requiring oxygen via Ventimask. Left-sided chest wall has hard swelling. CARDIOVASCULAR: S1, S2 normal. No murmurs, rubs, or gallops.  ABDOMEN: Soft, nontender, nondistended. Bowel sounds present. No organomegaly or mass.  EXTREMITIES: No pedal edema, cyanosis, or clubbing.  NEUROLOGIC: Cranial nerves II through XII are intact. Muscle strength 5/5 in all extremities. Sensation intact. Gait not checked.  PSYCHIATRIC: The patient  is alert and oriented x 3.  SKIN: No obvious rash, lesion, or ulcer.   LABORATORY PANEL:   CBC  Recent Labs Lab 01/22/2016 1125  WBC 13.9*  HGB 9.1*  HCT 29.8*  PLT 506*  MCV 88.4  MCH 26.9  MCHC 30.4*  RDW 17.9*   ------------------------------------------------------------------------------------------------------------------  Chemistries   Recent Labs Lab 12/25/2015 1125  NA 135  K 6.3*  CL 96*  CO2 15*  GLUCOSE 98  BUN 15  CREATININE 1.15*  CALCIUM 11.4*  AST 45*  ALT 10*  ALKPHOS 86  BILITOT 0.6   ------------------------------------------------------------------------------------------------------------------ estimated creatinine clearance is 46.7 mL/min (by C-G formula based on Cr of 1.15). ------------------------------------------------------------------------------------------------------------------ No results for input(s): TSH, T4TOTAL, T3FREE, THYROIDAB in the last  72 hours.  Invalid input(s): FREET3   Coagulation profile No results for input(s): INR, PROTIME in the last 168 hours. ------------------------------------------------------------------------------------------------------------------- No results for input(s): DDIMER in the last 72 hours. -------------------------------------------------------------------------------------------------------------------  Cardiac Enzymes  Recent Labs Lab 01/11/2016 1125  TROPONINI 0.04*   ------------------------------------------------------------------------------------------------------------------ Invalid input(s): POCBNP  ---------------------------------------------------------------------------------------------------------------  Urinalysis    Component Value Date/Time   COLORURINE AMBER* 01/06/2016 1258   APPEARANCEUR TURBID* 01/12/2016 1258   LABSPEC 1.020 01/07/2016 1258   PHURINE 5.0 12/31/2015 1258   GLUCOSEU 50* 01/10/2016 1258   HGBUR 1+* 01/18/2016 1258   BILIRUBINUR 1+* 01/08/2016 1258   KETONESUR NEGATIVE 01/08/2016 1258   PROTEINUR 100* 12/30/2015 1258   NITRITE NEGATIVE 01/10/2016 1258   LEUKOCYTESUR 2+* 01/07/2016 1258     RADIOLOGY: Ct Chest W Contrast  01/22/2016  CLINICAL DATA:  Patient presents with shortness of breath. Patient is a [facility] at [facility]. She has not been feeling well for a week. she is on home oxygen for COPD. Apparently she had a syncopal episode today. Patient denies chest pain. She reports cough and shortness of breath. Subjective fevers. No diaphoresis. No Calf pain or leg swelling. F/U chest xray. TKV. No hx ca EXAM: CT CHEST WITH CONTRAST TECHNIQUE: Multidetector CT imaging of the chest was performed during intravenous contrast administration. CONTRAST:  67m ISOVUE-300 IOPAMIDOL (ISOVUE-300) INJECTION 61% COMPARISON:  Current chest radiograph.  Chest CT, 10/28/2010. FINDINGS: There is a large heterogeneous, enhancing mass in the left  hemithorax that extends through the chest wall. This measures approximately 16 cm x 13 cm x 17.4 cm. The mass surrounds the left hilar structures occluding the left upper lobe bronchus and significantly narrowing the left upper lobe lingular segment pulmonary artery. Mass directly abuts the main and left pulmonary arteries and a left atrial appendage. It extends anteriorly and laterally through the chest wall, involving the anterior mediastinum. It has eroded through the majority of the sternum and involves the anterolateral first through fourth ribs. Portions of the mass extend just above the jugular notch and into the left axilla. No discrete mass or enlarged lymph node is seen in the neck base or axilla separate from the large mass. Mass also abuts the left pericardium and appears to invade portions of the pericardium adjacent to the left ventricle laterally and superiorly. There is a borderline right paratracheal lymph node measuring 9 mm in short axis. There are no right hilar masses or discrete enlarged lymph nodes. The The heart is mildly enlarged. Lungs and pleura: There are changes of parenchymal fibrosis in both lungs. There is opacity in portions of the left upper lobe consistent with compressive atelectasis. The oblique fissures depressed posteriorly by the large mass. No discrete lung mass or nodule is  seen. No pleural effusion. Limited upper abdomen: There is a low-density area in the anterior aspect of the left liver lobe measuring 15 mm which may reflect a metastatic lesion. It could potentially reflect focal fat but was not evident on the prior CT. No other visualize liver lesions. No adrenal masses on the included field of view. Musculoskeletal: There is a severe compression fracture of T5 new since the prior CT and a lateral chest radiograph dated 10/06/2010. Tumor again noted involving most of the sternum and the left first through fourth ribs. No other osteolytic lesions. IMPRESSION: 1. 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 Mass extends above the jugular notch and into the left axilla. 2. Single 15 mm low-density liver lesion in the left lobe suspicious for metastatic disease given chest findings. No adrenal masses noted on the included field of view. Electronically Signed   By: Lajean Manes M.D.   On: 12/30/2015 15:11   Dg Chest Portable 1 View  01/16/2016  CLINICAL DATA:  Syncope.  Short of breath. EXAM: PORTABLE CHEST 1 VIEW COMPARISON:  PET of 11/15/2010. Chest radiograph 10/06/2010. Chest CT 10/28/2010. FINDINGS: Midline trachea. Cardiomegaly accentuated by AP portable technique. No pleural effusion or pneumothorax. Extremely low lung volumes. left-sided airspace disease is mid and lower lung predominant. Underlying pulmonary interstitial prominence. IMPRESSION: Near complete opacification of the left hemi thorax, which could represent airspace disease (infection/ aspiration). An underlying mass cannot be excluded. At minimum, PA and lateral radiographs should be considered. Depending on clinical symptomatology, radiographic follow-up after antibiotic therapy versus more complete characterization with contrast-enhanced chest CT should be considered. Underlying pulmonary interstitial prominence could relate to emphysema or other interstitial lung disease. Electronically Signed   By: Abigail Miyamoto M.D.   On: 12/29/2015 12:08    EKG: Orders placed or performed during the hospital encounter of 01/15/2016  . ED EKG  . ED EKG  . EKG 12-Lead  . EKG 12-Lead  . EKG 12-Lead  . EKG 12-Lead    IMPRESSION AND PLAN:  * Acute on chronic respiratory failure   Secondary to lung mass and possible  malignancy      Baseline 3-4 L oxygen at home   Currently requiring high oxygen via Ventimask.   Pulmonary consult and oncology consult to help with this.   She will also need a palliative care consult as this mass is very huge.  * UTI with sepsis   IV vancomycin and Zosyn for now as with big malignancy in the lung that might be underlying some infection and Zosyn will help cover with UTI also.   Blood and urine culture are sent by ER.  * Hyperkalemia   Significant EKG changes   Calcium gluconate given by ER, I will give Kayexalate.  * Hypothyroidism   Continue levothyroxine.  * Diabetes   We'll keep him on insulin sliding scale coverage.  * COPD   Continue her inner and keep on nebulizers.   There is no active wheezing, her respiratory failure mainly seems like secondary to her lung mass.        All the records are reviewed and case discussed with ED provider. Management plans discussed with the patient, family and they are  in agreement.  CODE STATUS: full code  Code Status History    This patient does not have a recorded code status. Please follow your organizational policy for patients in this situation.       TOTAL TIME TAKING CARE OF THIS PATIENT: 50 critical care  minutes.    Vaughan Basta M.D on 12/25/2015   Between 7am to 6pm - Pager - [phone]  After 6pm go to [email or web address] - password [email or web address] [facility]  Office  [phone]  CC: Primary care physician; Lavera Guise, MD   Note: This dictation was prepared with Dragon dictation along with smaller phrase technology. Any transcriptional errors that result from this process are unintentional.

## 2015-12-24 NOTE — ED Notes (Signed)
Code  Sepsis  [facility]ed  To  [facility]

## 2015-12-24 NOTE — Consult Note (Addendum)
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 --We will attempt to rehydrate the patient, see if we can wean off the BiPAP and then consider a needle biopsy.   CARDIOVASCULAR A:   history of CHF  history of hypertension, hyperlipidemia P:   continuous telemetry management per primary  RENAL A:    UTI with sepsis Hyperkalemia P:    Continue vancomycin and Zosyn for now as per primary follow cultures Patient received calcium gluconate and Kayexalate in the ER  GASTROINTESTINAL A:    History of GERD P:  Full liquid diet    Protonix for GI prophylaxis  HEMATOLOGIC A:    No active issues P:   SCDs  DVT prophylaxis  INFECTIOUS A:    Urinary tract infection Leukocytosis P:    Follow cultures Follow CBC Monitor fever curve trend lactic acid Antibiotics as above  ENDOCRINE A:    Diabetes mellitus Hypothyroidism History of gout P:  Continue Synthroid   Blood sugar checks routine Sliding scale insulin coverage  Management per primary  NEUROLOGIC A:    schizoaffective disorder P:   RASS goal:0 Minimize sedation   CULTURES: 6/1 urine culture  6/1 blood culture  ANTIBIOTICS 6/1 vancomycin>> 6/1 Zosyn  SIGNIFICANT EVENTS: 6/1 patient was noted to have large lung carcinoma on the lower  left  LINES/TUBES none   -----------------------------------------------------------------------------   HISTORY OF PRESENT ILLNESS:   Beth Hunter is a 55 year old female with known history of hypertension, diabetes, COPD, GERD, schizoaffective disorder, rheumatoid arthritis, gout, hyperlipidemia, CHF. Patient is a resident at [facility]. She was not feeling well for a week. She is on home oxygen for COPD . Patient also complains of shortness of breath, and fevers, denies any chest pain ,diaphoresis, nausea and vomiting. She had a syncopal episode today and was sent to the emergency room at [facility]. She was noted to be hypoxic and her chest x-ray was noted to be completely whited out on her left side. CT of the chest was done which showed a huge lung mass involving the chest wall. On admission her lactic acid was  found to be elevated, sodium-135, K-6.3 BUN-15 creatinine-1.15, anion gap-24, BNP-543, WBC 13.9, platelets -506, patient's urine analysis was positive for UTI.  PAST MEDICAL HISTORY :  She  has a past medical history of Hypertension; Diabetes mellitus without complication ([place]); COPD (chronic obstructive pulmonary disease) ([place]); GERD (gastroesophageal reflux disease); Schizoaffective disorder ([place]); Rheumatoid arthritis ([place]); Gout; Hyperlipidemia; and CHF (congestive heart failure) ([place]).  PAST SURGICAL HISTORY: She  has no past surgical history on file.  No Known Allergies  No current facility-administered medications on file prior to encounter.   No current outpatient prescriptions on file prior to encounter.    FAMILY HISTORY:  Her has no family status information on file.   SOCIAL HISTORY: She  reports that she has never  smoked. She does not have any smokeless tobacco history on file.  REVIEW OF SYSTEMS:   Unable to obtain  SUBJECTIVE:  Unable to obtain as the patient is on BiPAP  VITAL SIGNS: BP 101/76 mmHg  Pulse 81   Temp(Src) 96.7 F (35.9 C) (Oral)  Resp 25  Ht '5\' 3"'$  (1.6 m)  Wt 61.5 kg (135 lb 9.3 oz)  BMI 24.02 kg/m2  SpO2 99%  HEMODYNAMICS:    VENTILATOR SETTINGS: Vent Mode:  [-]  FiO2 (%):  [50 %] 50 %  INTAKE / OUTPUT:    PHYSICAL EXAMINATION: General:  African-American female found to be on BiPAP Neuro: Alert and oriented, follows commands HEENT: Atraumatic, normocephalic, no discharge Cardiovascular: S1 and S2, no murmurs rubs or gallops Lungs: Very diminished left side, no wheezing, rhonchi or crackles noted Abdomen: Soft, nontender, nondistended Musculoskeletal: No inflammation or deformity noted Skin:  Grossly intact  LABS:  BMET  Recent Labs Lab 12/27/2015 1125  NA 135  K 6.3*  CL 96*  CO2 15*  BUN 15  CREATININE 1.15*  GLUCOSE 98    Electrolytes  Recent Labs Lab 01/12/2016 1125  CALCIUM 11.4*    CBC  Recent Labs Lab 01/18/2016 1125  WBC 13.9*  HGB 9.1*  HCT 29.8*  PLT 506*    Coag's No results for input(s): APTT, INR in the last 168 hours.  Sepsis Markers  Recent Labs Lab 12/29/2015 1200 01/01/2016 1503  LATICACIDVEN 10.0* 5.8*    ABG No results for input(s): PHART, PCO2ART, PO2ART in the last 168 hours.  Liver Enzymes  Recent Labs Lab 01/17/2016 1125  AST 45*  ALT 10*  ALKPHOS 86  BILITOT 0.6  ALBUMIN 2.4*    Cardiac Enzymes  Recent Labs Lab 12/27/2015 1125  TROPONINI 0.04*    Glucose  Recent Labs Lab 12/28/2015 1812  GLUCAP 163*    Imaging Ct Chest W Contrast  01/06/2016  CLINICAL DATA:  Patient presents with shortness of breath. Patient is a resident at [facility]. She has not been feeling well for a week. she is on home oxygen for COPD. Apparently she had a syncopal episode today. Patient denies chest pain. She reports cough and shortness of breath. Subjective fevers. No diaphoresis. No Calf pain or leg swelling. F/U chest xray. TKV. No hx ca EXAM: CT CHEST WITH CONTRAST TECHNIQUE: Multidetector CT imaging  of the chest was performed during intravenous contrast administration. CONTRAST:  55m ISOVUE-300 IOPAMIDOL (ISOVUE-300) INJECTION 61% COMPARISON:  Current chest radiograph.  Chest CT, 10/28/2010. FINDINGS: There is a large heterogeneous, enhancing mass in the left hemithorax that extends through the chest wall. This measures approximately 16 cm x 13 cm x 17.4 cm. The mass surrounds the left hilar structures occluding the left upper lobe bronchus and significantly narrowing the left upper lobe lingular segment pulmonary artery. Mass directly abuts the main and left pulmonary arteries and a left atrial appendage. It extends anteriorly and laterally through the chest wall, involving the anterior mediastinum. It has eroded through the majority of the sternum and involves the anterolateral first through fourth ribs. Portions of the mass extend just above the jugular notch and into the left axilla. No discrete mass or enlarged lymph node is seen in the neck base or axilla separate from the large mass. Mass also abuts the left pericardium and appears to invade portions of the pericardium adjacent to the left ventricle laterally and superiorly. There is a borderline right paratracheal lymph node measuring 9 mm in  short axis. There are no right hilar masses or discrete enlarged lymph nodes. The The heart is mildly enlarged. Lungs and pleura: There are changes of parenchymal fibrosis in both lungs. There is opacity in portions of the left upper lobe consistent with compressive atelectasis. The oblique fissures depressed posteriorly by the large mass. No discrete lung mass or nodule is seen. No pleural effusion. Limited upper abdomen: There is a low-density area in the anterior aspect of the left liver lobe measuring 15 mm which may reflect a metastatic lesion. It could potentially reflect focal fat but was not evident on the prior CT. No other visualize liver lesions. No adrenal masses on the included field of view.  Musculoskeletal: There is a severe compression fracture of T5 new since the prior CT and a lateral chest radiograph dated 10/06/2010. Tumor again noted involving most of the sternum and the left first through fourth ribs. No other osteolytic lesions. IMPRESSION: 1. 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 Mass extends above the jugular notch and into the left axilla. 2. Single 15 mm low-density liver lesion in the left lobe suspicious for metastatic disease given chest findings. No adrenal masses noted on the included field of view. Electronically Signed   By: Lajean Manes M.D.   On: 01/22/2016 15:11   Dg Chest Portable 1 View  01/20/2016  CLINICAL DATA:  Syncope.  Short of breath. EXAM: PORTABLE CHEST 1 VIEW COMPARISON:  PET of 11/15/2010. Chest radiograph 10/06/2010. Chest CT 10/28/2010. FINDINGS: Midline trachea. Cardiomegaly accentuated by AP portable technique. No pleural effusion or pneumothorax. Extremely low lung volumes. left-sided airspace disease is mid and lower lung predominant. Underlying pulmonary interstitial prominence. IMPRESSION: Near complete opacification of the left hemi thorax, which could represent airspace disease (infection/ aspiration). An underlying mass cannot be excluded. At minimum, PA and lateral radiographs should be considered. Depending on clinical symptomatology, radiographic follow-up after antibiotic therapy versus more complete characterization with contrast-enhanced chest CT should be considered. Underlying pulmonary interstitial prominence could relate to emphysema or other interstitial lung disease. Electronically Signed   By: Abigail Miyamoto M.D.   On: 12/28/2015 12:08      STUDIES:  6/1 CT chest>1. Large mass consistent with a primary malignancy, likely lungcarcinoma, which extends from the left hilum along the along theleft mediastinum into the anterior mediastinum extending anteriorlyand laterally through the chest wall eroding most of the sternum as well as portions of the anterior and lateral first through fourth ribs on the left. Mass extends above the jugular notch and into theleft axilla. Single 15 mm low-density liver lesion in the left lobe suspiciousfor metastatic disease given chest findings. No adrenal masses notedon the included field of view.     [place]   12/28/2015, 7:01 PM  -I was present for the history and physical, I agree with the assessment and plan as detailed above. The patient has decreased breath sounds on the left side. Initial lactic acid was elevated at 5.8. Review of chest x-ray images from 6/1 shows near complete left lung atelectasis with rightward mediastinal shift. Review the CT of the chest from 6/1 shows a very large left lung. Chest mass, there is chest  wall invasion, involvement of the sternum, which is actually visible and palpable from the surface, there is also involvement of the mediastinum in the pericardium. We will attempt to rehydrate the patient, see if we can wean off the BiPAP and then consider an needle biopsy.  -Deep Linsie Lupo,   12/25/2015  Critical Care Attestation.  I have personally obtained a history, examined the patient, evaluated laboratory and imaging results, formulated the assessment and plan and placed orders. The Patient requires high complexity decision making for assessment and support, frequent evaluation and titration of therapies, application of advanced monitoring technologies and extensive interpretation of multiple databases. The patient has critical illness that could lead imminently to failure of 1 or more organ systems  and requires the highest level of physician preparedness to intervene.  Critical Care Time devoted to patient care services described in this note is 35 minutes and is exclusive of time spent in procedures.

## 2015-12-24 NOTE — Consult Note (Signed)
Pharmacy Antibiotic Note  Beth Hunter is a 55 y.o. female admitted on 12/31/2015 with sepsis.  Pharmacy has been consulted for vancomycin and zosyn dosing.  Plan: Patient received 1g of vancomycin in the ED. Will start vancomycin '500mg'$  q 12 hours starting 11 hours after initial dose for stacked dosing Vancomycin 500 IV every 12 hours.  Goal trough 15-20 mcg/mL. Zosyn 3.375g IV q8h (4 hour infusion). Will draw trough at steady state 6/3 @ 1130  Height: '5\' 1"'$  (154.9 cm) Weight: 136 lb 14.4 oz (62.097 kg) IBW/kg (Calculated) : 47.8  Temp (24hrs), Avg:97.8 F (36.6 C), Min:97.8 F (36.6 C), Max:97.8 F (36.6 C)   Recent Labs Lab 01/09/2016 1125 01/02/2016 1200 01/12/2016 1503  WBC 13.9*  --   --   CREATININE 1.15*  --   --   LATICACIDVEN  --  10.0* 5.8*    Estimated Creatinine Clearance: 46.7 mL/min (by C-G formula based on Cr of 1.15).    No Known Allergies  Antimicrobials this admission: vancomycin 6/1 >>  zosyn 6/1 >>   Dose adjustments this admission:   Microbiology results: 6/1 BCx: pending 6/1 UCx: pending    Thank you for allowing pharmacy to be a part of this patient's care.  Ramond Dial, Pharm.D Clinical Pharmacist  01/08/2016 3:53 PM

## 2015-12-24 NOTE — ED Provider Notes (Signed)
Patient with hypoxia on nasal cannula, BiPAP ordered.  Lavonia Drafts, MD 01/16/2016 [phone]

## 2015-12-24 NOTE — ED Notes (Signed)
Patient is a resident at [facility] and was on sitting in the front seat of the car on her way to a doctors office visit when patient had a syncopal episode.  [facility] staff patient had not been feeling well for about 1 week.  On EMS arrival patient was awake but slightly confused,. EMS stated car was hot and had no AC. Patient on 4L/[facility] home oxygen.

## 2015-12-24 NOTE — Progress Notes (Signed)

## 2015-12-25 DIAGNOSIS — R748 Abnormal levels of other serum enzymes: Secondary | ICD-10-CM

## 2015-12-25 DIAGNOSIS — K219 Gastro-esophageal reflux disease without esophagitis: Secondary | ICD-10-CM

## 2015-12-25 DIAGNOSIS — I10 Essential (primary) hypertension: Secondary | ICD-10-CM

## 2015-12-25 DIAGNOSIS — F209 Schizophrenia, unspecified: Secondary | ICD-10-CM

## 2015-12-25 DIAGNOSIS — J449 Chronic obstructive pulmonary disease, unspecified: Secondary | ICD-10-CM

## 2015-12-25 DIAGNOSIS — Z515 Encounter for palliative care: Secondary | ICD-10-CM

## 2015-12-25 DIAGNOSIS — E119 Type 2 diabetes mellitus without complications: Secondary | ICD-10-CM

## 2015-12-25 DIAGNOSIS — A419 Sepsis, unspecified organism: Principal | ICD-10-CM

## 2015-12-25 DIAGNOSIS — N390 Urinary tract infection, site not specified: Secondary | ICD-10-CM

## 2015-12-25 DIAGNOSIS — N179 Acute kidney failure, unspecified: Secondary | ICD-10-CM

## 2015-12-25 DIAGNOSIS — F17210 Nicotine dependence, cigarettes, uncomplicated: Secondary | ICD-10-CM

## 2015-12-25 DIAGNOSIS — C3492 Malignant neoplasm of unspecified part of left bronchus or lung: Secondary | ICD-10-CM

## 2015-12-25 DIAGNOSIS — E872 Acidosis: Secondary | ICD-10-CM

## 2015-12-25 DIAGNOSIS — R7989 Other specified abnormal findings of blood chemistry: Secondary | ICD-10-CM

## 2015-12-25 LAB — GLUCOSE, CAPILLARY
GLUCOSE-CAPILLARY: 98 mg/dL (ref 65–99)
GLUCOSE-CAPILLARY: 96 mg/dL (ref 65–99)
GLUCOSE-CAPILLARY: 115 mg/dL — AB (ref 65–99)
Glucose-Capillary: 131 mg/dL — ABNORMAL HIGH (ref 65–99)

## 2015-12-25 LAB — BASIC METABOLIC PANEL
ANION GAP: 10 (ref 5–15)
BUN: 16 mg/dL (ref 6–20)
CALCIUM: 10.4 mg/dL — AB (ref 8.9–10.3)
CO2: 28 mmol/L (ref 22–32)
Chloride: 101 mmol/L (ref 101–111)
Creatinine, Ser: 0.54 mg/dL (ref 0.44–1.00)
GFR calc Af Amer: >60 mL/min (ref >60)
GFR calc non Af Amer: >60 mL/min (ref >60)
GLUCOSE: 92 mg/dL (ref 65–99)
Potassium: 4 mmol/L (ref 3.5–5.1)
Sodium: 139 mmol/L (ref 135–145)

## 2015-12-25 LAB — CBC
HEMATOCRIT: 26.1 % — AB (ref 35.0–47.0)
HEMOGLOBIN: 8.1 g/dL — AB (ref 12.0–16.0)
MCH: 26.1 pg (ref 26.0–34.0)
MCHC: 30.9 g/dL — AB (ref 32.0–36.0)
MCV: 84.4 fL (ref 80.0–100.0)
Platelets: 381 10*3/uL (ref 150–440)
RBC: 3.09 MIL/uL — AB (ref 3.80–5.20)
RDW: 17.7 % — ABNORMAL HIGH (ref 11.5–14.5)
WBC: 11.7 10*3/uL — ABNORMAL HIGH (ref 3.6–11.0)

## 2015-12-25 LAB — BLOOD GAS, ARTERIAL
Acid-Base Excess: 6.8 mmol/L — ABNORMAL HIGH (ref 0.0–3.0)
Allens test (pass/fail): POSITIVE — AB
Bicarbonate: 32.3 mEq/L — ABNORMAL HIGH (ref 21.0–28.0)
Delivery systems: POSITIVE
EXPIRATORY PAP: 8
FIO2: 0.5
INSPIRATORY PAP: 14
O2 SAT: 94.4 %
PH ART: 7.41 (ref 7.350–7.450)
PO2 ART: 72 mmHg — AB (ref 83.0–108.0)
Patient temperature: 37
pCO2 arterial: 51 mmHg — ABNORMAL HIGH (ref 32.0–48.0)

## 2015-12-25 LAB — LACTATE DEHYDROGENASE: LDH: 268 U/L — AB (ref 98–192)

## 2015-12-25 MED ORDER — METOPROLOL TARTRATE 5 MG/5ML IV SOLN
2.5000 mg | INTRAVENOUS | Status: DC | PRN
  Administered 2015-12-25: 5 mg via INTRAVENOUS
  Filled 2015-12-25: qty 5

## 2015-12-25 MED ORDER — INSULIN ASPART 100 UNIT/ML ~~LOC~~ SOLN: 0.0000 [IU] | Freq: Three times a day (TID) | SUBCUTANEOUS | Status: DC

## 2015-12-25 MED ORDER — LORAZEPAM 2 MG/ML IJ SOLN
1.0000 mg | INTRAMUSCULAR | Status: DC | PRN
  Administered 2015-12-26 (×2): 1 mg via INTRAVENOUS
  Filled 2015-12-25 (×2): qty 1

## 2015-12-25 MED ORDER — BISACODYL 10 MG RE SUPP: 10.0000 mg | Freq: Every day | RECTAL | Status: DC | PRN

## 2015-12-25 MED ORDER — IPRATROPIUM-ALBUTEROL 0.5-2.5 (3) MG/3ML IN SOLN: 3.0000 mL | RESPIRATORY_TRACT | Status: DC | PRN

## 2015-12-25 MED ORDER — PROCHLORPERAZINE 25 MG RE SUPP
25.0000 mg | Freq: Three times a day (TID) | RECTAL | Status: DC | PRN
  Filled 2015-12-25: qty 1

## 2015-12-25 MED ORDER — DEXTROSE-NACL 5-0.45 % IV SOLN
INTRAVENOUS | Status: DC
  Administered 2015-12-25: 11:00:00 via INTRAVENOUS

## 2015-12-25 MED ORDER — ONDANSETRON HCL 4 MG/2ML IJ SOLN: 4.0000 mg | Freq: Four times a day (QID) | INTRAMUSCULAR | Status: DC | PRN

## 2015-12-25 MED ORDER — MORPHINE 100MG IN NS 100ML (1MG/ML) PREMIX INFUSION
1.0000 mg/h | INTRAVENOUS | Status: DC
  Administered 2015-12-26: 1 mg/h via INTRAVENOUS
  Filled 2015-12-25: qty 100

## 2015-12-25 MED ORDER — INSULIN ASPART 100 UNIT/ML ~~LOC~~ SOLN: 0.0000 [IU] | Freq: Every day | SUBCUTANEOUS | Status: DC

## 2015-12-25 MED ORDER — SODIUM CHLORIDE 0.9 % IV SOLN
1.0000 mg/h | INTRAVENOUS | Status: DC
  Filled 2015-12-25: qty 10

## 2015-12-25 MED ORDER — METOPROLOL TARTRATE 5 MG/5ML IV SOLN
INTRAVENOUS | Status: AC
  Administered 2015-12-25: 5 mg via INTRAVENOUS
  Filled 2015-12-25: qty 5

## 2015-12-25 NOTE — Consult Note (Signed)
[name]  Patient Care Team: Lavera Guise, MD as PCP - General (Internal Medicine)  CHIEF COMPLAINTS/PURPOSE OF CONSULTATION: Large chest wall mass concerning for malignancy.   HISTORY OF PRESENTING ILLNESS: Please note patient is a poor historian; also limited by difficulty; on BiPAP. Patient's family not available. Patient's 2 sons are expected to visit pt soon.   Beth Hunter 55 y.o.  female nonsmoker; history of COPD and also significant psychiatric illness-schizoaffective disorder; rheumatoid arthritis/congestive heart failure currently lives in a group home is currently admitted to the hospital for worsening shortness of breath.  CT scan in the emergency room showed- a large mass left hemithorax [16-17 cm in size] eroding into the sternum/chest wall. Patient is currently on BiPAP- saturations above 90s. Patient's saturations drop 70s rapidly off BiPAP; also not tolerating high flow nasal oxygen.   Patient states her left chest wall mass been there for "a long time"; this has not bothered her. She is wanting to go home.    ROS: Review of systems cannot be done because of above given reasons.  MEDICAL HISTORY:  Past Medical History  Diagnosis Date  . Hypertension   . Diabetes mellitus without complication ([place])   . COPD (chronic obstructive pulmonary disease) ([place])   . GERD (gastroesophageal reflux disease)   . Schizoaffective disorder ([place])   . Rheumatoid arthritis ([place])   . Gout   . Hyperlipidemia   . CHF (congestive heart failure) ([facility])     SURGICAL HISTORY: No past surgical history on file.  SOCIAL HISTORY: group home resident.  Social History   Social History  . Marital Status: Single    Spouse Name: N/A  . Number of Children: N/A  . Years of Education: N/A   Occupational History  . Not on file.   Social History Main Topics  . Smoking status: Never Smoker   . Smokeless tobacco: Not on file  . Alcohol Use: Not on file  .  Drug Use: Not on file  . Sexual Activity: Not on file   Other Topics Concern  . Not on file   Social History Narrative  . No narrative on file    FAMILY HISTORY: Family History  Problem Relation Age of Onset  . Diabetes Mother     ALLERGIES:  has No Known Allergies.  MEDICATIONS:  Current Facility-Administered Medications  Medication Dose Route Frequency Provider Last Rate Last Dose  . 0.9 %  sodium chloride infusion   Intravenous Continuous Vaughan Basta, MD 75 mL/hr at 01/08/2016 2000    . acetaminophen (TYLENOL) tablet 325 mg  325 mg Oral Q6H PRN Vaughan Basta, MD      . albuterol (PROVENTIL) (2.5 MG/3ML) 0.083% nebulizer solution 2.5 mg  2.5 mg Nebulization Q6H PRN Vaughan Basta, MD      . ARIPiprazole (ABILIFY) tablet 10 mg  10 mg Oral QHS Vaughan Basta, MD   10 mg at 01/06/2016 2200  . bosentan (TRACLEER) tablet 125 mg  125 mg Oral BID Vaughan Basta, MD   125 mg at 12/25/15 1000  . calcium-vitamin D (OSCAL WITH D) 500-200 MG-UNIT per tablet 1 tablet  1 tablet Oral BID Vaughan Basta, MD   1 tablet at 12/25/15 0813  . citalopram (CELEXA) tablet 20 mg  20 mg Oral QHS Vaughan Basta, MD   20 mg at 12/24/2015 2200  . clonazePAM (KLONOPIN) tablet 0.5 mg  0.5 mg Oral QHS Vaughan Basta, MD   0.5 mg at 01/05/2016 2200  .  colchicine tablet 0.6 mg  0.6 mg Oral Daily PRN Vaughan Basta, MD      . darifenacin (ENABLEX) 24 hr tablet 7.5 mg  7.5 mg Oral Daily Vaughan Basta, MD   7.5 mg at 12/25/15 0813  . dextrose 5 %-0.45 % sodium chloride infusion   Intravenous Continuous Laverle Hobby, MD 75 mL/hr at 12/25/15 1035    . ferrous sulfate tablet 325 mg  325 mg Oral Daily Vaughan Basta, MD   325 mg at 12/25/15 0813  . folic acid (FOLVITE) tablet 1 mg  1 mg Oral Daily Vaughan Basta, MD   1 mg at 12/25/15 0813  . heparin injection 5,000 Units  5,000 Units Subcutaneous Q8H Vaughan Basta, MD    5,000 Units at 12/25/15 0620  . hydroxychloroquine (PLAQUENIL) tablet 400 mg  400 mg Oral Daily Vaughan Basta, MD   400 mg at 12/25/15 2229  . insulin aspart (novoLOG) injection 0-5 Units  0-5 Units Subcutaneous QHS Sital Mody, MD      . insulin aspart (novoLOG) injection 0-9 Units  0-9 Units Subcutaneous TID WC Sital Mody, MD      . ipratropium-albuterol (DUONEB) 0.5-2.5 (3) MG/3ML nebulizer solution 3 mL  3 mL Nebulization Q4H Vaughan Basta, MD   3 mL at 12/25/15 1145  . levothyroxine (SYNTHROID, LEVOTHROID) tablet 100 mcg  100 mcg Oral QAC breakfast Vaughan Basta, MD   100 mcg at 12/25/15 0813  . mometasone-formoterol (DULERA) 100-5 MCG/ACT inhaler 2 puff  2 puff Inhalation BID Vaughan Basta, MD   2 puff at 12/25/15 0815  . pantoprazole (PROTONIX) EC tablet 40 mg  40 mg Oral Daily Vaughan Basta, MD   40 mg at 12/25/15 0813  . piperacillin-tazobactam (ZOSYN) IVPB 3.375 g  3.375 g Intravenous Q8H Melissa D Maccia, RPH   3.375 g at 12/25/15 1035  . predniSONE (DELTASONE) tablet 2.5 mg  2.5 mg Oral Daily Vaughan Basta, MD   2.5 mg at 12/25/15 0814  . tiotropium (SPIRIVA) inhalation capsule 18 mcg  18 mcg Inhalation Daily Vaughan Basta, MD   18 mcg at 12/25/15 0815      .  PHYSICAL EXAMINATION:   Filed Vitals:   12/25/15 0900 12/25/15 1000  BP: 109/70 111/70  Pulse: 96 98  Temp:    Resp: 24 29   Filed Weights   01/20/2016 1136 01/19/2016 1813  Weight: 136 lb 14.4 oz (62.097 kg) 135 lb 9.3 oz (61.5 kg)    GENERAL:Cachectic appearing female patient wearing a BiPAP. No family. EYES: no pallor or icterus OROPHARYNX: Cannot be examined. NECK: supple, no masses felt LYMPH:  no palpable lymphadenopathy in the cervical, axillary or inguinal regions LUNGS: Decreased breath sounds bilaterally.  No wheeze or crackles; a large swelling noted left chest wall/sternal area. HEART/CVS: regular rate & rhythm and no murmurs; No lower extremity  edema ABDOMEN: abdomen soft, non-tender and normal bowel sounds Musculoskeletal:no cyanosis of digits and no clubbing  PSYCH: alert & oriented x 3; cannot be tested further. NEURO: no focal motor/sensory deficits SKIN:  no rashes or significant lesions  LABORATORY DATA:  I have reviewed the data as listed Lab Results  Component Value Date   WBC 11.7* 12/25/2015   HGB 8.1* 12/25/2015   HCT 26.1* 12/25/2015   MCV 84.4 12/25/2015   PLT 381 12/25/2015    Recent Labs  01/10/2016 1125 01/08/2016 2134 12/25/15 0312  NA 135  --  139  K 6.3* 4.5 4.0  CL 96*  --  101  CO2  15*  --  28  GLUCOSE 98  --  92  BUN 15  --  16  CREATININE 1.15*  --  0.54  CALCIUM 11.4*  --  10.4*  GFRNONAA 53*  --  >60  GFRAA >60  --  >60  PROT 6.9  --   --   ALBUMIN 2.4*  --   --   AST 45*  --   --   ALT 10*  --   --   ALKPHOS 86  --   --   BILITOT 0.6  --   --     RADIOGRAPHIC STUDIES: I have personally reviewed the radiological images as listed and agreed with the findings in the report. Ct Chest W Contrast  12/31/2015  CLINICAL DATA:  Patient presents with shortness of breath. Patient is a resident at [facility]. She has not been feeling well for a week. she is on home oxygen for COPD. Apparently she had a syncopal episode today. Patient denies chest pain. She reports cough and shortness of breath. Subjective fevers. No diaphoresis. No Calf pain or leg swelling. F/U chest xray. TKV. No hx ca EXAM: CT CHEST WITH CONTRAST TECHNIQUE: Multidetector CT imaging of the chest was performed during intravenous contrast administration. CONTRAST:  83m ISOVUE-300 IOPAMIDOL (ISOVUE-300) INJECTION 61% COMPARISON:  Current chest radiograph.  Chest CT, 10/28/2010. FINDINGS: There is a large heterogeneous, enhancing mass in the left hemithorax that extends through the chest wall. This measures approximately 16 cm x 13 cm x 17.4 cm. The mass surrounds the left hilar structures occluding the left upper lobe  bronchus and significantly narrowing the left upper lobe lingular segment pulmonary artery. Mass directly abuts the main and left pulmonary arteries and a left atrial appendage. It extends anteriorly and laterally through the chest wall, involving the anterior mediastinum. It has eroded through the majority of the sternum and involves the anterolateral first through fourth ribs. Portions of the mass extend just above the jugular notch and into the left axilla. No discrete mass or enlarged lymph node is seen in the neck base or axilla separate from the large mass. Mass also abuts the left pericardium and appears to invade portions of the pericardium adjacent to the left ventricle laterally and superiorly. There is a borderline right paratracheal lymph node measuring 9 mm in short axis. There are no right hilar masses or discrete enlarged lymph nodes. The The heart is mildly enlarged. Lungs and pleura: There are changes of parenchymal fibrosis in both lungs. There is opacity in portions of the left upper lobe consistent with compressive atelectasis. The oblique fissures depressed posteriorly by the large mass. No discrete lung mass or nodule is seen. No pleural effusion. Limited upper abdomen: There is a low-density area in the anterior aspect of the left liver lobe measuring 15 mm which may reflect a metastatic lesion. It could potentially reflect focal fat but was not evident on the prior CT. No other visualize liver lesions. No adrenal masses on the included field of view. Musculoskeletal: There is a severe compression fracture of T5 new since the prior CT and a lateral chest radiograph dated 10/06/2010. Tumor again noted involving most of the sternum and the left first through fourth ribs. No other osteolytic lesions. IMPRESSION: 1. 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 Mass extends above the jugular notch and into the left axilla. 2. Single 15 mm low-density liver lesion in the left lobe suspicious for metastatic disease given chest findings. No adrenal masses noted on the included field of view. Electronically Signed   By: Lajean Manes M.D.   On: 01/04/2016 15:11   Dg Chest Portable 1 View  12/30/2015  CLINICAL DATA:  Syncope.  Short of breath. EXAM: PORTABLE CHEST 1 VIEW COMPARISON:  PET of 11/15/2010. Chest radiograph 10/06/2010. Chest CT 10/28/2010. FINDINGS: Midline trachea. Cardiomegaly accentuated by AP portable technique. No pleural effusion or pneumothorax. Extremely low lung volumes. left-sided airspace disease is mid and lower lung predominant. Underlying pulmonary interstitial prominence. IMPRESSION: Near complete opacification of the left hemi thorax, which could represent airspace disease (infection/ aspiration). An underlying mass cannot be excluded. At minimum, PA and lateral radiographs should be considered. Depending on clinical symptomatology, radiographic follow-up after antibiotic therapy versus more complete characterization with contrast-enhanced chest CT should be considered. Underlying pulmonary interstitial prominence could relate to emphysema or other interstitial lung disease. Electronically Signed   By: Abigail Miyamoto M.D.   On: 01/19/2016 12:08    ASSESSMENT & PLAN:   # 55 year old female patient with multiple medical problems [facility] resident currently admitted to hospital for worsening shortness of breath noted to have a large mass in the left hemithorax [17 x 16 cm].  # Left hemithorax large mass [17 x 13 x 16 cm]-malignancy unless proven otherwise. Recommend biopsy- for confirmation. If the family is interested; and patient is clinically stable-  biopsy of the chest wall mass could be done through radiology.   # Acute respiratory failure/COPD- on BiPAP as per primary team/pulmonary.   # Patient's prognosis is very poor; clinically this appears to be malignancy. Given patient's psychiatric illness/poor social support/multiple medical problems- it would be difficult to offer chemotherapy/radiation; also suspect patient's tolerance to aggressive therapies would be poor. I conveyed my thoughts to Dr. Megan Salon to the palliative care doctor. She is awaiting to evaluate the patient this afternoon.  Thank you Dr.Mody  for allowing me to participate in the care of your pleasant patient. Please do not hesitate to contact me with questions or concerns in the interim.    Cammie Sickle, MD 12/25/2015 1:37 PM

## 2015-12-25 NOTE — Care Management (Signed)
Informed by Toy Baker that she has spoke with patient's sone- Beth Hunter [phone]) Beth Hunter is speaking with other son Chrissie Noa and both will be coming to hospital to visit patient and to speak with physicians.  Updated contacts in [place] and primary nurse

## 2015-12-25 NOTE — Progress Notes (Signed)
Patient continues on bipap, tried to wean to hiflow but pt did not tolerate well. Family has been at bedside and updated, plan is to do palliative care for pt tomorrow. Patient alert and oreinted, aware of planned care. Dr Megan Salon and Rama have extensively spoken with family and pt.

## 2015-12-25 NOTE — Progress Notes (Signed)
RT called to room due to difficulty breathing to place patient back on Bipap.  Bipap replaced and patient tolerating well at this time.

## 2015-12-25 NOTE — Care Management (Signed)
Patient presents from [place] for respiratory distress.  Admitted to icu stepdown due to need for continuous bipap.  Will attempt HFNC.  Not able to withstand conversation without desatting.  CXR show complete white out of left lung field.  Oncology consult is pending for this large lung mass.  Palliative care consult placed as patient is a full code and may have some difficult decision to make.

## 2015-12-25 NOTE — Progress Notes (Signed)
Initial Nutrition Assessment  DOCUMENTATION CODES:   Not applicable  INTERVENTION:  - Monitor intake and diet progression - Recommend Ensure Enlive po BID, each supplement provides 350 kcal and 20 grams of protein    NUTRITION DIAGNOSIS:   Inadequate oral intake related to acute illness as evidenced by per patient/family report.    GOAL:   Patient will meet greater than or equal to 90% of their needs    MONITOR:   PO intake, Supplement acceptance  REASON FOR ASSESSMENT:   Malnutrition Screening Tool    ASSESSMENT:     Pt admitted with acute on chronic respiratory failure secondary to lung mass and possible malignancy.  Had fall at group home prior to admission. Palliative care consult planned.  Pt also with UTI sepsis  Past Medical History  Diagnosis Date  . Hypertension   . Diabetes mellitus without complication ([place])   . COPD (chronic obstructive pulmonary disease) ([place])   . GERD (gastroesophageal reflux disease)   . Schizoaffective disorder ([place])   . Rheumatoid arthritis ([place])   . Gout   . Hyperlipidemia   . CHF (congestive heart failure) ([facility])     Medications reviewed: Fe Sulfate, folic acid, D5 1/2 NS at 23m/hr Labs reviewed: Calcium 10.4, WBC 11.7  Respiratory and RN placing pt on bipap when this writer walked in room.  Pt reports that she was eating good prior to admission.  Also stated that she is breathing better now and is ready to go home. Noted per chart pt has not been feeling well for the past week, gradually getting weaker.  Nutrition-Focused physical exam completed. Findings are no fat depletion, mild/moderate  muscle depletion, and no edema.    Diet Order:  Diet NPO time specified  Skin:  Reviewed, no issues  Last BM:  PTA  Height:   Ht Readings from Last 1 Encounters:  01/19/2016 '5\' 3"'$  (1.6 m)    Weight: noted wt loss for the past 1-2 months per chart (no wt encounters and pt unable to tell this wProbation officer  Wt Readings from Last 1  Encounters:  12/29/2015 135 lb 9.3 oz (61.5 kg)    Ideal Body Weight:     BMI:  Body mass index is 24.02 kg/(m^2).  Estimated Nutritional Needs:   Kcal:  15521-7471kcals/d  Protein:  92-106 g/d  Fluid:  1.8-2 L/d  EDUCATION NEEDS:   No education needs identified at this time  Brita Jurgensen B. AZenia [place](pager) Weekend/On-Call pager [phone]

## 2015-12-25 NOTE — Progress Notes (Signed)
   12/25/15 1700  Clinical Encounter Type  Visited With Patient  Visit Type Patient actively dying  Referral From Nurse  Consult/Referral To Chaplain  Spiritual Encounters  Spiritual Needs Prayer  Stress Factors  Patient Stress Factors Exhausted;Health changes  Family Stress Factors Not reviewed  Visited patient who was responsive. Provided prayer per patient request. Will follow up at 2400hrs to provide extreme unction for the sick. Chap. Kaysey Berndt G. [place]

## 2015-12-25 NOTE — Progress Notes (Signed)
Update:   Pt noted to have a history of severe pulmonary hypertension followed at [facility] clinic, she is on adcirca 40 mg daily, and Tracleer 125 mg bid. She has chronic respiratory failure on 4L oxygen, and has COPD on spiriva.  She was noted on her most recent visit there to have had a 30 lb (now 40 lb) weight loss in the past few months.   Reviewing the CT of the chest from 6/1 there is a very large left lung and Chest mass, there is chest wall invasion, involvement of the sternum, which is actually visible and palpable from the surface, there is also involvement of the mediastinum and the pericardium.   --Will start patients home [place] meds (will need to be brought in) to see if this may help her hypoxia.   --Will treat with abx empirically for post-obstructive pneumonia. I am uncertain if this pt's respiratory failure is acute, or chronically progressive from the lung mass, but suspect the latter.   -- I spoke with the patient's son Raegan, he said that he noticed the lump in her chest around 3 months ago, but it was small back then. He noticed it again 2 to 3 weeks ago and it was large, he asked her to get it checked out but she declined saying that it was not bothering her. I explained to him that she has a massive lung tumor that is most likely cancer that is actively killing her. Even if we could treat it, it would be doubtful that we could intervene in time to save her life. I explained that her condition is terminal.   Ashby Dawes, M.D.  12/25/2015

## 2015-12-25 NOTE — Care Management (Signed)
Patient says that Toy Baker is her POA and HCPOA.  Spoke with Estill Bamberg and she says that she is not legally patient's HCPOA/POA.  Patient has been at her facility so long that they have become very close.  Patient has 2 sons that visit patient at the family care home:   Chalice Philbert and Linnell Fulling.  Addresses unknown and Estill Bamberg does not have any phone numbers for them.  Has a number [phone] but does not know who it belongs to.  CM left a voicemail message on this line asking to return call Psychologist, educational).

## 2015-12-25 NOTE — Consult Note (Signed)
Palliative Medicine Inpatient Consult Note   Name: Beth Hunter Date: 12/25/2015 MRN: [id number]  DOB: April 29, 1961  Referring Physician: Bettey Costa, MD  Palliative Care consult requested for this 55 y.o. female for goals of medical therapy in patient with psychiatric illnesses and a large hemithorax chest mass (which is a malignancy unless proven otherwise  DISCUSSIONS AND PLAN: Dr Beth Hunter, [place] physician/ pulmonologist, and I met with the two sons, Beth Hunter and Beth Hunter.  Dr Beth Hunter conveyed the details of the extent of the lung mass and the fact that this size of a tumor is not 'fixable' since it is invading part of the heart (pericardium) and has already eaten into other parts of the body such as the sternum.  I related my conversation with Dr Beth Hunter, oncologist, who does not feel there is a viable treatment option for pt.  We both talked about her need for BIPAP and that this is not a way to live --and she will only be getting worse and needing more and more oxygen as the cancer grows.  Both sons understood all that was conveyed as they were able to talk about the issues asking appropriate questions and addressing decisions rationally.   The decision was made to leave pt on BIPAP until tomorrow and to remove her from the BIPAP at 2:30 tomorrow.  She can be placed on 1 LPM oxygen via [place] at that time with a morphine drip started prior to withdrawal from BIPAP.  I explained how morphine is used to trick the brain into thinking that the lungs are breathing 'just fine' when they are not.  The sons are aware that pt will be dying from her lung cancer and not from the morphine.  If pt lingers beyond an hour, then she can be transferred to Unit 1C for terminal comfort care.  I will place the orders needed for this process to go forward.  I will trim meds to primary comfort meds.   I also got permission to change pt to DNR status and this order will be placed as well.   CLINICAL  NARRATIVE: Pt had not been feeling well for a week or so and was sent to the Ed due to weakness and shortness of breath associated with a near-syncopal episode/ fall at the group home. CXR showed a whited out left lung so a CT was done and this showed pts entire left lung to be involved with a tumor.  She was requiring more than her usual 2 LPM of O2 also.  She had high potassium and evidence of a possible UTI with a high lactic acid.  She was placed in the ICU and she went from ventimask to BIPAP. She was tried off of BIPAP this afternoon for about ten minutes, but failed being on even very HI Flow oxygen.  Pt has mental illness and is not rational when spoken to. --continually just saying she feels fine and wants to go home.  Critical Care physician has called one son and the two sons are coming up here.   ACTIVE PROBLEMS: Acute on Chronic respiratory failure with hypercarbia and hypoxia ---unable to be off of BIPAP today for longer than 10 minutes (was put on Hi Flow 70% and high Liters)    --sats dropped into the 70% range so is back on BIPAP Left Hemithorax large lung mass ---16 -17 cm in size erording into the sternum and chest wall and visible externally with 'swollen area over upper sternum.   ---cancer  until proven otherwise per Dr. Burlene Hunter, oncologist ---pt said that the mass on chest has been there a long time and never bothered her. ---previous imaging studies are summarized at the bottom of this note. Interstitial Lung disease ---prior imaging studies all mention this Post Obstructive Pneumonia COPD Smoker --son, Beth Hunter, says he took her cigarettes away two weeks ago. She smoked all her life till then. Rheumatoid Arthritis Schizoaffective Disorder on psychotropic meds HTN HLD Gout DM2 GERD Presbyesophagus Limited social/ family support --though two sons are reportedly on their way here. Hypercalcemia with CA 10/4 today (was 11.4 yesterday) Acute on Chronic Renal Failure with Cr  on adm at 1.15 and now down to 0.54 Malnutrition with Alb at 2.4 and depletion of fat stores --recent severe wt loss of over 30 lbs due to cancer effects with anorexia/ loss of appetite  Elevated liver enzymes Elevated troponin Lactic Acidosis  Pulmonary HTN [facility] at [facility] and on pulm HTN meds  Known Cardiomyopathy with echo in 09/23/2011 with EF 40-45% Glaucoma hypothyroidsim --post ablation for toxic nodular goiter Hyperkalemia UTI with sepsis    PAST MEDICAL HISTORY: Past Medical History  Diagnosis Date  . Hypertension   . Diabetes mellitus without complication ([place])   . COPD (chronic obstructive pulmonary disease) ([place])   . GERD (gastroesophageal reflux disease)   . Schizoaffective disorder ([place])   . Rheumatoid arthritis ([place])   . Gout   . Hyperlipidemia   . CHF (congestive heart failure) ([place])     PAST SURGICAL HISTORY: No past surgical history on file She did have radioactive ablation for toxic multinodular goiter in 2010. Marland Kitchen   REVIEW OF SYSTEMS:  Patient is not able to provide ROS in detail b/c she is a poor and unreliable historian  She had apparently been on home O2 at the group home.    SPIRITUAL SUPPORT SYSTEM: questionable amount of support.    SOCIAL HISTORY:  reports that she has never smoked. She does not have any smokeless tobacco history on file.  Pt has two sons. They have been hard to reach, however, the pts contact at the care home has located one of the sons and he is going to reach the other one and they are going to come up here to talk to the doctors.  She has resided at [facility] / Group home.    LEGAL DOCUMENTS:  I will place a portable DNR form in the paper chart.   CODE STATUS: Now changed to DNR.     ALLERGIES:  has No Known Allergies.  MEDICATIONS:  Current Facility-Administered Medications  Medication Dose Route Frequency Provider Last Rate Last Dose  . 0.9 %  sodium chloride infusion   Intravenous Continuous Vaughan Basta, MD 75 mL/hr at 01/04/2016 2000    . acetaminophen (TYLENOL) tablet 325 mg  325 mg Oral Q6H PRN Vaughan Basta, MD      . albuterol (PROVENTIL) (2.5 MG/3ML) 0.083% nebulizer solution 2.5 mg  2.5 mg Nebulization Q6H PRN Vaughan Basta, MD      . ARIPiprazole (ABILIFY) tablet 10 mg  10 mg Oral QHS Vaughan Basta, MD   10 mg at 01/20/2016 2200  . bosentan (TRACLEER) tablet 125 mg  125 mg Oral BID Vaughan Basta, MD   125 mg at 12/25/15 1000  . calcium-vitamin D (OSCAL WITH D) 500-200 MG-UNIT per tablet 1 tablet  1 tablet Oral BID Vaughan Basta, MD   1 tablet at 12/25/15 0813  . citalopram (CELEXA) tablet 20 mg  20 mg Oral QHS Vaughan Basta, MD   20 mg at 01/12/2016 2200  . clonazePAM (KLONOPIN) tablet 0.5 mg  0.5 mg Oral QHS Vaughan Basta, MD   0.5 mg at 12/25/2015 2200  . colchicine tablet 0.6 mg  0.6 mg Oral Daily PRN Vaughan Basta, MD      . darifenacin (ENABLEX) 24 hr tablet 7.5 mg  7.5 mg Oral Daily Vaughan Basta, MD   7.5 mg at 12/25/15 0813  . dextrose 5 %-0.45 % sodium chloride infusion   Intravenous Continuous Laverle Hobby, MD 75 mL/hr at 12/25/15 1035    . ferrous sulfate tablet 325 mg  325 mg Oral Daily Vaughan Basta, MD   325 mg at 12/25/15 0813  . folic acid (FOLVITE) tablet 1 mg  1 mg Oral Daily Vaughan Basta, MD   1 mg at 12/25/15 0813  . heparin injection 5,000 Units  5,000 Units Subcutaneous Q8H Vaughan Basta, MD   5,000 Units at 12/25/15 0620  . hydroxychloroquine (PLAQUENIL) tablet 400 mg  400 mg Oral Daily Vaughan Basta, MD   400 mg at 12/25/15 0109  . insulin aspart (novoLOG) injection 0-5 Units  0-5 Units Subcutaneous QHS Sital Mody, MD      . insulin aspart (novoLOG) injection 0-9 Units  0-9 Units Subcutaneous TID WC Sital Mody, MD      . ipratropium-albuterol (DUONEB) 0.5-2.5 (3) MG/3ML nebulizer solution 3 mL  3 mL Nebulization Q4H Vaughan Basta, MD   3 mL at  12/25/15 1145  . levothyroxine (SYNTHROID, LEVOTHROID) tablet 100 mcg  100 mcg Oral QAC breakfast Vaughan Basta, MD   100 mcg at 12/25/15 0813  . mometasone-formoterol (DULERA) 100-5 MCG/ACT inhaler 2 puff  2 puff Inhalation BID Vaughan Basta, MD   2 puff at 12/25/15 0815  . pantoprazole (PROTONIX) EC tablet 40 mg  40 mg Oral Daily Vaughan Basta, MD   40 mg at 12/25/15 0813  . piperacillin-tazobactam (ZOSYN) IVPB 3.375 g  3.375 g Intravenous Q8H Melissa D Maccia, RPH   3.375 g at 12/25/15 1035  . predniSONE (DELTASONE) tablet 2.5 mg  2.5 mg Oral Daily Vaughan Basta, MD   2.5 mg at 12/25/15 0814  . tiotropium (SPIRIVA) inhalation capsule 18 mcg  18 mcg Inhalation Daily Vaughan Basta, MD   18 mcg at 12/25/15 0815    Vital Signs: BP 111/70 mmHg  Pulse 98  Temp(Src) 98.3 F (36.8 C) (Axillary)  Resp 29  Ht 5' 3"  (1.6 m)  Wt 61.5 kg (135 lb 9.3 oz)  BMI 24.02 kg/m2  SpO2 92% Filed Weights   01/14/2016 1136 01/21/2016 1813  Weight: 62.097 kg (136 lb 14.4 oz) 61.5 kg (135 lb 9.3 oz)    Estimated body mass index is 24.02 kg/(m^2) as calculated from the following:   Height as of this encounter: 5' 3"  (1.6 m).   Weight as of this encounter: 61.5 kg (135 lb 9.3 oz).  PERFORMANCE STATUS (ECOG) : 4 - Bedbound and BIPAP (?Hi Flow dependent so far)  PHYSICAL EXAM: Mildly distressed on BIPAP in ICU ---had just been changed to HI Flow, but sats dropped to 70's so was put back on BIPAP EOMI OP clear No JVD or TM Hrt rrr no m Lungs with ronchi Chest wall has a swollen area of left chest wall and sternum Abd soft and NT Ext no cyanosis or mottling Alert and grossly oriented, but lacking an appreciation of her condition  ---she says "I feel better now so I can go home  now'  (back on BIPAP)  ---she says that the chest wall swelling area has 'never bothered her and its been there a long time'  LABS: CBC:    Component Value Date/Time   WBC 11.7* 12/25/2015  0312   HGB 8.1* 12/25/2015 0312   HCT 26.1* 12/25/2015 0312   PLT 381 12/25/2015 0312   MCV 84.4 12/25/2015 0312   Comprehensive Metabolic Panel:    Component Value Date/Time   NA 139 12/25/2015 0312   K 4.0 12/25/2015 0312   CL 101 12/25/2015 0312   CO2 28 12/25/2015 0312   BUN 16 12/25/2015 0312   CREATININE 0.54 12/25/2015 0312   GLUCOSE 92 12/25/2015 0312   CALCIUM 10.4* 12/25/2015 0312   AST 45* 01/16/2016 1125   ALT 10* 12/28/2015 1125   ALKPHOS 86 01/09/2016 1125   BILITOT 0.6 01/18/2016 1125   PROT 6.9 01/05/2016 1125   ALBUMIN 2.4* 01/13/2016 1125    CXR chest 2010: 1. There is nonspecific thickening of the interstitial lung markings  bilaterally. Interstitial fibrosis or interstitial pneumonia would be the  primary considerations. Interstitial edema is also a possibility but at  this  point is thought to be less likely.  2. There is noted a density inferior to the right hilum probably  representing a confluence of vascular and osseous markings.  3. The chest could be further evaluated by CT, if clinically indicated.   Bronchoscopy was done on 05/22/2009 by Dr. Humphrey Rolls due to pulmonary nodules.  No path is located in current EMR system.   CXR 10/06/2010: 1. There is increased density at the left hilum compatible with pneumonia,  atelectasis or progressive fibrosis.  2. There is diffuse bilateral thickening of the interstitial lung  markings, consistent with fibrosis; however, an element of superimposed edema cannot  be definitely excluded. There is, however, no frank pulmonary edema and no pleural effusion is seen.  3. Stable cardiomegaly.  PET scan 11/15/2010 No significant abnormality identified.   CTa chest 10/28/2010: 1. No CT evidence of pulmonary embolus.  2. Bilateral patchy upper lobe airspace disease concerning for pneumonia.  Recommend followup radiography to document complete resolution following  adequate medical therapy. If there  is not complete resolution, then  recommend further evaluation with CT of the chest to exclude underlying  pathology.  3. Bilateral lower lobe interstitial thickening and a small right pleural  effusion which may reflect mild pulmonary edema.  4. Mediastinal and bilateral hilar lymphadenopathy which is nonspecific  and may be secondary to an infectious or inflammatory etiology although  malignancy such as lymphoma cannot be excluded.  5. There is likely underlying chronic interstitial lung disease.   CXR 12/27/2015: Near complete opacification of the left hemi thorax, which could represent airspace disease (infection/ aspiration). An underlying mass cannot be excluded. Underlying pulmonary interstitial prominence could relate to emphysema or other interstitial lung disease.   CT chest 01/17/2016:  1. 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 Mass extends above the jugular notch and into the left axilla. 2. Single 15 mm low-density liver lesion in the left lobe suspicious for metastatic disease given chest findings. No adrenal masses noted on the included field of view.   More than 50%  of the visit was spent in counseling/coordination of care: Yes  Time Spent:  110 minutes

## 2015-12-25 NOTE — Consult Note (Signed)
Pharmacy Antibiotic Note  Beth Hunter is a 55 y.o. female admitted on 01/04/2016 with UTI with sepsis.  Pharmacy has been consulted for vancomycin and zosyn dosing.  Plan: After discussion with Dr. Ashby Dawes, source is urinary or post-obstructive PNA with MRSA PCR negative so will d/c vancomycin.  Continue Zosyn 3.375g IV q8h (4 hour infusion).  Height: '5\' 3"'$  (160 cm) Weight: 135 lb 9.3 oz (61.5 kg) IBW/kg (Calculated) : 52.4  Temp (24hrs), Avg:97.7 F (36.5 C), Min:96.7 F (35.9 C), Max:98.3 F (36.8 C)   Recent Labs Lab 01/13/2016 1125 01/17/2016 1200 01/09/2016 1503 12/25/15 0312  WBC 13.9*  --   --  11.7*  CREATININE 1.15*  --   --  0.54  LATICACIDVEN  --  10.0* 5.8*  --     Estimated Creatinine Clearance: 65.7 mL/min (by C-G formula based on Cr of 0.54).    No Known Allergies  Antimicrobials this admission: vancomycin 6/1 >> 6/2 zosyn 6/1 >>   Dose adjustments this admission:   Microbiology results: 6/1 BCx: NGTD x 2 6/1 UCx: pending  6/1 MRSA PCR: negative   Thank you for allowing pharmacy to be a part of this patient's care.  Ulice Dash, PharmD Clinical Pharmacist   12/25/2015 12:57 PM

## 2015-12-25 NOTE — Progress Notes (Signed)
LCSW met with patient and was unable to continue to communicate as patient was on vent/hard to breathe and talk. LCSW will come back later in afternoon.  [employer] LCSW [phone]

## 2015-12-25 NOTE — Progress Notes (Signed)
Pt HR 140-170' notified Dr. Bridget Hartshorn who ordered prn cardizem. Will continue to reassess.

## 2015-12-25 NOTE — Progress Notes (Signed)
Patient switched from BIPAP to hi flow [place], tolerating well. Alert and oriented. No ss of distress noted.

## 2015-12-25 NOTE — Progress Notes (Signed)
[facility] [place] NAME: Beth Hunter    MR#:  [id number]  DATE OF BIRTH:  17-Aug-1960  SUBJECTIVE:  Patient Remains on BiPAP  REVIEW OF SYSTEMS:    Review of Systems  Constitutional: Negative for fever, chills and malaise/fatigue.  HENT: Negative for ear discharge, ear pain, hearing loss, nosebleeds and sore throat.   Eyes: Negative for blurred vision and pain.  Respiratory: Positive for cough and shortness of breath. Negative for hemoptysis and wheezing.   Cardiovascular: Negative for chest pain, palpitations and leg swelling.  Gastrointestinal: Negative for nausea, vomiting, abdominal pain, diarrhea and blood in stool.  Genitourinary: Negative for dysuria.  Musculoskeletal: Negative for back pain.  Neurological: Negative for dizziness, tremors, speech change, focal weakness, seizures and headaches.  Endo/Heme/Allergies: Does not bruise/bleed easily.  Psychiatric/Behavioral: Negative for depression, suicidal ideas and hallucinations.    Tolerating Diet NPO     DRUG ALLERGIES:  No Known Allergies  VITALS:  Blood pressure 111/70, pulse 98, temperature 98.3 F (36.8 C), temperature source Axillary, resp. rate 29, height '5\' 3"'$  (1.6 m), weight 61.5 kg (135 lb 9.3 oz), SpO2 92 %.  PHYSICAL EXAMINATION:   Physical Exam  Constitutional: She is oriented to person, place, and time and well-developed, well-nourished, and in no distress. No distress.  HENT:  Head: Normocephalic.  Eyes: Right eye exhibits no discharge. No scleral icterus.  On bipap  Neck: No JVD present. No tracheal deviation present. Thyromegaly present.  Cardiovascular: Normal rate, regular rhythm and normal heart sounds.  Exam reveals no gallop and no friction rub.   No murmur heard. Pulmonary/Chest: Effort normal. No respiratory distress. She has wheezes. She has no rales. She exhibits no tenderness.  Decreased left lung   Abdominal: Soft. Bowel sounds are  normal. She exhibits no distension and no mass. There is no tenderness. There is no rebound and no guarding.  Musculoskeletal: Normal range of motion. She exhibits no edema.  Neurological: She is alert and oriented to person, place, and time.  Skin: Skin is warm. No rash noted. No erythema.  Large chest wall mass  Psychiatric: Affect normal.      LABORATORY PANEL:   CBC  Recent Labs Lab 12/25/15 0312  WBC 11.7*  HGB 8.1*  HCT 26.1*  PLT 381   ------------------------------------------------------------------------------------------------------------------  Chemistries   Recent Labs Lab 01/02/2016 1125  12/25/15 0312  NA 135  --  139  K 6.3*  < > 4.0  CL 96*  --  101  CO2 15*  --  28  GLUCOSE 98  --  92  BUN 15  --  16  CREATININE 1.15*  --  0.54  CALCIUM 11.4*  --  10.4*  AST 45*  --   --   ALT 10*  --   --   ALKPHOS 86  --   --   BILITOT 0.6  --   --   < > = values in this interval not displayed. ------------------------------------------------------------------------------------------------------------------  Cardiac Enzymes  Recent Labs Lab 12/25/2015 1125  TROPONINI 0.04*   ------------------------------------------------------------------------------------------------------------------  RADIOLOGY:  Ct Chest W Contrast  01/20/2016  CLINICAL DATA:  Patient presents with shortness of breath. [facility] is a [facility] at [facility]. She has not been feeling well for a week. she is on home oxygen for COPD. Apparently she had a syncopal episode today. Patient denies chest pain. She reports cough and shortness of breath. Subjective fevers. No diaphoresis. No Calf pain or  leg swelling. F/U chest xray. TKV. No hx ca EXAM: CT CHEST WITH CONTRAST TECHNIQUE: Multidetector CT imaging of the chest was performed during intravenous contrast administration. CONTRAST:  30m ISOVUE-300 IOPAMIDOL (ISOVUE-300) INJECTION 61% COMPARISON:  Current chest radiograph.  Chest CT,  10/28/2010. FINDINGS: There is a large heterogeneous, enhancing mass in the left hemithorax that extends through the chest wall. This measures approximately 16 cm x 13 cm x 17.4 cm. The mass surrounds the left hilar structures occluding the left upper lobe bronchus and significantly narrowing the left upper lobe lingular segment pulmonary artery. Mass directly abuts the main and left pulmonary arteries and a left atrial appendage. It extends anteriorly and laterally through the chest wall, involving the anterior mediastinum. It has eroded through the majority of the sternum and involves the anterolateral first through fourth ribs. Portions of the mass extend just above the jugular notch and into the left axilla. No discrete mass or enlarged lymph node is seen in the neck base or axilla separate from the large mass. Mass also abuts the left pericardium and appears to invade portions of the pericardium adjacent to the left ventricle laterally and superiorly. There is a borderline right paratracheal lymph node measuring 9 mm in short axis. There are no right hilar masses or discrete enlarged lymph nodes. The The heart is mildly enlarged. Lungs and pleura: There are changes of parenchymal fibrosis in both lungs. There is opacity in portions of the left upper lobe consistent with compressive atelectasis. The oblique fissures depressed posteriorly by the large mass. No discrete lung mass or nodule is seen. No pleural effusion. Limited upper abdomen: There is a low-density area in the anterior aspect of the left liver lobe measuring 15 mm which may reflect a metastatic lesion. It could potentially reflect focal fat but was not evident on the prior CT. No other visualize liver lesions. No adrenal masses on the included field of view. Musculoskeletal: There is a severe compression fracture of T5 new since the prior CT and a lateral chest radiograph dated 10/06/2010. Tumor again noted involving most of the sternum and the  left first through fourth ribs. No other osteolytic lesions. IMPRESSION: 1. 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 Mass extends above the jugular notch and into the left axilla. 2. Single 15 mm low-density liver lesion in the left lobe suspicious for metastatic disease given chest findings. No adrenal masses noted on the included field of view. Electronically Signed   By: DLajean ManesM.D.   On: 01/09/2016 15:11   Dg Chest Portable 1 View  01/21/2016  CLINICAL DATA:  Syncope.  Short of breath. EXAM: PORTABLE CHEST 1 VIEW COMPARISON:  PET of 11/15/2010. Chest radiograph 10/06/2010. Chest CT 10/28/2010. FINDINGS: Midline trachea. Cardiomegaly accentuated by AP portable technique. No pleural effusion or pneumothorax. Extremely low lung volumes. left-sided airspace disease is mid and lower lung predominant. Underlying pulmonary interstitial prominence. IMPRESSION: Near complete opacification of the left hemi thorax, which could represent airspace disease (infection/ aspiration). An underlying mass cannot be excluded. At minimum, PA and lateral radiographs should be considered. Depending on clinical symptomatology, radiographic follow-up after antibiotic therapy versus more complete characterization with contrast-enhanced chest CT should be considered. Underlying pulmonary interstitial  prominence could relate to emphysema or other interstitial lung disease. Electronically Signed   By: Abigail Miyamoto M.D.   On: 12/28/2015 12:08     ASSESSMENT AND PLAN:   55 year old female with a history of diabetes and COPD who presented with shortness of breath and found to have large left lung mass encroaching entire  mediastinum with likely metastatic disease to the left lobe of the liver.  1. Acute hypoxic respiratory failure: This is due to lung mass with possible postobstructive pneumonia. Continue BiPAP and nebulizer treatment. She is desaturating with any movement on bipap Continue Zosyn Consider positive care consult  2. Sepsis presenting with tachycardia, tachypnea and leukocytosis: As is due to possible postobstructive pneumonia and Urinary tract infection: Continue Zosyn and follow up on urine culture. Lactic acid is decreasing 3. Large left lung mass: Consult oncology.  4. Diabetes: Continue sliding scale insulin 5. Hypothyroid: Continue Synthroid  6. Schizoaffective disorder: Continue psych meds. 7. Rheumatoid arthritis on chronic prednisone and Plaquenil..  Management plans discussed with the patient and she is in agreement.  CODE STATUS: full  CRITICAL CARE TOTAL TIME TAKING CARE OF THIS PATIENT: 30 minutes.   High risk for complete resp failure with need for intubation  POSSIBLE D/C ??, DEPENDING ON CLINICAL CONDITION.   Dulcey Riederer M.D on 12/25/2015 at 12:18 PM  Between 7am to 6pm - Pager - [phone] After 6pm go to [email or web address] - password [email or web address] [facility]  Office  [phone]  CC: Primary care physician; Lavera Guise, MD  Note: This dictation was prepared with Dragon dictation along with smaller phrase technology. Any transcriptional errors that result from this process are unintentional.

## 2015-12-26 DIAGNOSIS — J9622 Acute and chronic respiratory failure with hypercapnia: Secondary | ICD-10-CM

## 2015-12-26 DIAGNOSIS — J9621 Acute and chronic respiratory failure with hypoxia: Secondary | ICD-10-CM

## 2015-12-26 DIAGNOSIS — R400 Somnolence: Secondary | ICD-10-CM

## 2015-12-26 DIAGNOSIS — R918 Other nonspecific abnormal finding of lung field: Secondary | ICD-10-CM

## 2015-12-27 LAB — URINE CULTURE

## 2015-12-29 LAB — CULTURE, BLOOD (ROUTINE X 2)
CULTURE: NO GROWTH
Culture: NO GROWTH

## 2016-01-23 NOTE — Progress Notes (Signed)
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 P:   Continue on BiPAP Keep sats greater than 88% continue DuoNeb/albuterol As per discussions yesterday with the family and palliative care. The patient is being transitioned to comfort measures only, with a intense to withdraw BiPAP and allow the patient to pass away this afternoon. -Orders of artery been placed by palliative care service.  CULTURES: 6/1 urine culture  6/1 blood culture  ANTIBIOTICS 6/1 vancomycin>> 6/1 Zosyn  SIGNIFICANT EVENTS: 6/1 patient was noted to have large lung carcinoma on the lower left  LINES/TUBES none   -----------------------------------------------------------------------------  SUBJECTIVE:  Unable to obtain as the patient is on BiPAP.  The patient is unable to provide review of systems due to presence of facial BiPAP, and critical illness.  VITAL SIGNS: BP 102/73 mmHg  Pulse 76  Temp(Src) 98.6 F (37 C) (Axillary)  Resp 18  Ht '5\' 3"'$  (1.6 m)  Wt 135 lb 9.3 oz (61.5 kg)  BMI 24.02 kg/m2  SpO2 97%  HEMODYNAMICS:    VENTILATOR SETTINGS: Vent Mode:  [-]  FiO2 (%):  [40 %-60 %] 40 %  INTAKE / OUTPUT: I/O last 3 completed shifts: In: 2246.3 [P.O.:120; I.V.:1876.3; IV Piggyback:250] Out: -   PHYSICAL EXAMINATION: General:  African-American female found to be on BiPAP Neuro: Very lethargic, minimally arousable. HEENT: Atraumatic, normocephalic, no discharge Cardiovascular: S1  and S2, no murmurs rubs or gallops, large parasternal mass present. Lungs: Very diminished left side, no wheezing, rhonchi or crackles noted Abdomen: Soft, nontender, nondistended Musculoskeletal: No inflammation or deformity noted Skin:  Grossly intact  LABS:  BMET  Recent Labs Lab 01/07/2016 1125 01/20/2016 2134 12/25/15 0312  NA 135  --  139  K 6.3* 4.5 4.0  CL 96*  --  101  CO2 15*  --  28  BUN 15  --  16  CREATININE 1.15*  --  0.54  GLUCOSE 98  --  92    Electrolytes  Recent Labs Lab 01/17/2016 1125 12/25/15 0312  CALCIUM 11.4* 10.4*    CBC  Recent Labs Lab 12/28/2015 1125 12/25/15 0312  WBC 13.9* 11.7*  HGB 9.1* 8.1*  HCT 29.8* 26.1*  PLT 506* 381    Coag's No results for input(s): APTT, INR in the last 168 hours.  Sepsis Markers  Recent Labs Lab 12/27/2015 1200 01/04/2016 1503  LATICACIDVEN 10.0* 5.8*    ABG  Recent Labs Lab 12/25/15 0900  PHART 7.41  PCO2ART 51*  PO2ART 72*    Liver Enzymes  Recent Labs Lab 12/29/2015 1125  AST 45*  ALT 10*  ALKPHOS 86  BILITOT 0.6  ALBUMIN 2.4*    Cardiac Enzymes  Recent Labs Lab 01/17/2016 1125  TROPONINI 0.04*    Glucose  Recent Labs Lab 01/20/2016 1812 01/04/2016 2006 01/18/2016 2355 12/25/15 0343 12/25/15 0735 12/25/15 1212  GLUCAP 163* 133* 131* 98 96 115*    Imaging No results found.

## 2016-01-23 NOTE — Progress Notes (Signed)
LCSW met with entire family and they were advised of Palliative measure for this patient. Chaplain was contacted for the family and LCSW provided support.  I am available as required. [employer] [phone]

## 2016-01-23 NOTE — Progress Notes (Signed)
Beth Hunter visited with Nurse Linna Hoff about his patient. Beth Hunter's health has not improved and is scheduled to have life support measures withdrawn at 2:30pm today. Chaplain will support patient and family and assist any way possible.

## 2016-01-23 NOTE — Discharge Summary (Signed)
Physician Discharge Summary  Patient ID: Beth Hunter MRN: [id number] DOB/AGE: 1961/03/22 55 y.o.  Admit date: 01/15/2016 Discharge date: 01/11/16  Admission Diagnoses:  Discharge Diagnoses:  Principal Problem:   Acute on chronic respiratory failure [facility]) Active Problems:   Lung mass   Discharged Condition: deceased  Hospital Course:  The patient presented to the ICU with respiratory failure/distress. She was found to have a large left lung mass with invasion of mediastinum, pericardium, sternum with visible tumor on the sternum. Given her functional status and degree of respiratory distress, it was felt that she would not have a reasonable change of recovery. She was maintained on bipap.  On 6/2 in discussion with family and palliative care service the pt was made DNR.  On 6/3 her bipap was removed and she was kept on nasal cannula oxygen and morphine drip and passed away peacefully soon after.   Consults: Oncology, palliative care.   Significant Diagnostic Studies: Ct chest.   Treatments: Empiric abx.   Discharge Exam:pls see today's note.  Blood pressure 91/69, pulse 88, temperature 98.6 F (37 C), temperature source Axillary, resp. rate 18, height '5\' 3"'$  (1.6 m), weight 135 lb 9.3 oz (61.5 kg), SpO2 99 %.   Disposition:  Deceased.      Signed: Laverle Hobby 01/11/16, 7:07 PM

## 2016-01-23 NOTE — Progress Notes (Signed)
Beth Hunter   DOB:April 16, 1961   GY#:[id number]    Subjective: Patient is in a morphine drip/sleeping-sedated. Continues to be on BiPAP. No family to the bedside. After discussion with family;patient is currently on palliative care measures only/plan for terminal wean this afternoon.  Objective:  Filed Vitals:   01/03/16 0900 01-03-2016 1000  BP: 102/73 97/66  Pulse: 76 79  Temp:    Resp: 18 18     Intake/Output Summary (Last 24 hours) at 2016/01/03 1115 Last data filed at 01/03/16 1000  Gross per 24 hour  Intake 787.17 ml  Output      0 ml  Net 787.17 ml    GENERAL:Cachectic appearing female patient wearing a BiPAP. No family. She is on morphine drip. Sleeping unable to wake up. EYES: no pallor or icterus OROPHARYNX: Cannot be examined. NECK: supple, no masses felt LUNGS: Decreased breath sounds bilaterally. No wheeze or crackles; a large swelling noted left chest wall/sternal area. HEART/CVS: regular rate & rhythm and no murmurs; No lower extremity edema ABDOMEN: abdomen soft, non-tender and normal bowel sounds Musculoskeletal:no cyanosis of digits and no clubbing  PSYCH: Sleeping NEURO: no focal motor/sensory deficits SKIN: no rashes or significant lesions   Labs:  Lab Results  Component Value Date   WBC 11.7* 12/25/2015   HGB 8.1* 12/25/2015   HCT 26.1* 12/25/2015   MCV 84.4 12/25/2015   PLT 381 12/25/2015    Lab Results  Component Value Date   NA 139 12/25/2015   K 4.0 12/25/2015   CL 101 12/25/2015   CO2 28 12/25/2015    Studies:  Ct Chest W Contrast  12/31/2015  CLINICAL DATA:  Patient presents with shortness of breath. Patient is a resident at [facility]. She has not been feeling well for a week. she is on home oxygen for COPD. Apparently she had a syncopal episode today. Patient denies chest pain. She reports cough and shortness of breath. Subjective fevers. No diaphoresis. No Calf pain or leg swelling. F/U chest xray. TKV. No hx ca EXAM:  CT CHEST WITH CONTRAST TECHNIQUE: Multidetector CT imaging of the chest was performed during intravenous contrast administration. CONTRAST:  28m ISOVUE-300 IOPAMIDOL (ISOVUE-300) INJECTION 61% COMPARISON:  Current chest radiograph.  Chest CT, 10/28/2010. FINDINGS: There is a large heterogeneous, enhancing mass in the left hemithorax that extends through the chest wall. This measures approximately 16 cm x 13 cm x 17.4 cm. The mass surrounds the left hilar structures occluding the left upper lobe bronchus and significantly narrowing the left upper lobe lingular segment pulmonary artery. Mass directly abuts the main and left pulmonary arteries and a left atrial appendage. It extends anteriorly and laterally through the chest wall, involving the anterior mediastinum. It has eroded through the majority of the sternum and involves the anterolateral first through fourth ribs. Portions of the mass extend just above the jugular notch and into the left axilla. No discrete mass or enlarged lymph node is seen in the neck base or axilla separate from the large mass. Mass also abuts the left pericardium and appears to invade portions of the pericardium adjacent to the left ventricle laterally and superiorly. There is a borderline right paratracheal lymph node measuring 9 mm in short axis. There are no right hilar masses or discrete enlarged lymph nodes. The The heart is mildly enlarged. Lungs and pleura: There are changes of parenchymal fibrosis in both lungs. There is opacity in portions of the left upper lobe consistent with compressive atelectasis. The oblique  fissures depressed posteriorly by the large mass. No discrete lung mass or nodule is seen. No pleural effusion. Limited upper abdomen: There is a low-density area in the anterior aspect of the left liver lobe measuring 15 mm which may reflect a metastatic lesion. It could potentially reflect focal fat but was not evident on the prior CT. No other visualize liver  lesions. No adrenal masses on the included field of view. Musculoskeletal: There is a severe compression fracture of T5 new since the prior CT and a lateral chest radiograph dated 10/06/2010. Tumor again noted involving most of the sternum and the left first through fourth ribs. No other osteolytic lesions. IMPRESSION: 1. 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 Mass extends above the jugular notch and into the left axilla. 2. Single 15 mm low-density liver lesion in the left lobe suspicious for metastatic disease given chest findings. No adrenal masses noted on the included field of view. Electronically Signed   By: Lajean Manes M.D.   On: 01/13/2016 15:11   Dg Chest Portable 1 View  12/27/2015  CLINICAL DATA:  Syncope.  Short of breath. EXAM: PORTABLE CHEST 1 VIEW COMPARISON:  PET of 11/15/2010. Chest radiograph 10/06/2010. Chest CT 10/28/2010. FINDINGS: Midline trachea. Cardiomegaly accentuated by AP portable technique. No pleural effusion or pneumothorax. Extremely low lung volumes. left-sided airspace disease is mid and lower lung predominant. Underlying pulmonary interstitial prominence. IMPRESSION: Near complete opacification of the left hemi thorax, which could represent airspace disease (infection/ aspiration). An underlying mass cannot be excluded. At minimum, PA and lateral radiographs should be considered. Depending on clinical symptomatology, radiographic follow-up after antibiotic therapy versus more complete characterization with contrast-enhanced chest CT should be considered. Underlying pulmonary interstitial prominence could relate to emphysema or other interstitial lung disease. Electronically  Signed   By: Abigail Miyamoto M.D.   On: 01/03/2016 12:08    Assessment & Plan:   # 55 year old female patient with multiple medical problems [facility] on resident currently admitted to hospital for worsening shortness of breath noted to have a large mass in the left hemithorax [17 x 16 cm].  # Left hemithorax large mass [17 x 13 x 16 cm]-malignancy unless proven otherwise-causing acute respiratory failure on top of chronic respiratory failure. Unable to wean off the BiPAP. Patient has significant/multiple medical comorbidities including psychiatric/group home resident. Patient is not a good candidate for any aggressive or even palliative measures-like chemotherapy or radiation given above reasons. This was discussed with Dr. Jamesetta Orleans care. Poor prognosis has been discussed with family/2 sons. Patient is currently on morphine drip for symptom control/shortness of breath. Plan for withdrawal of care this afternoon. Also discussed with Dr. Ram/critical care.    Cammie Sickle, MD 2016-01-08  11:15 AM

## 2016-01-23 NOTE — Progress Notes (Signed)
[place] [facility] [place] NAME: Beth Hunter    MR#:  [id number]  DATE OF BIRTH:  1961/07/11  SUBJECTIVE:  Patient Remains on BiPAP Every time any attempt to remove patient off the BiPAP she gets very short of breath REVIEW OF SYSTEMS:    Review of Systems  Constitutional: Negative for fever, chills and malaise/fatigue.  HENT: Negative for ear discharge, ear pain, hearing loss, nosebleeds and sore throat.   Eyes: Negative for blurred vision and pain.  Respiratory: Positive for cough and shortness of breath. Negative for hemoptysis and wheezing.   Cardiovascular: Negative for chest pain, palpitations and leg swelling.  Gastrointestinal: Negative for nausea, vomiting, abdominal pain, diarrhea and blood in stool.  Genitourinary: Negative for dysuria.  Musculoskeletal: Negative for back pain.  Neurological: Negative for dizziness, tremors, speech change, focal weakness, seizures and headaches.  Endo/Heme/Allergies: Does not bruise/bleed easily.  Psychiatric/Behavioral: Negative for depression, suicidal ideas and hallucinations.    Tolerating Diet NPO     DRUG ALLERGIES:  No Known Allergies  VITALS:  Blood pressure 145/98, pulse 113, temperature 98.6 F (37 C), temperature source Axillary, resp. rate 18, height '5\' 3"'$  (1.6 m), weight 61.5 kg (135 lb 9.3 oz), SpO2 92 %.  PHYSICAL EXAMINATION:   Physical Exam  critically ill Constitutional: She is oriented to person, place, and time and well-developed, well-nourished, and in no distress. No distress.  HENT:  Head: Normocephalic.  Eyes: Right eye exhibits no discharge. No scleral icterus.  On bipap  Neck: No JVD present. No tracheal deviation present. Thyromegaly present.  Cardiovascular: Normal rate, regular rhythm and normal heart sounds.  Exam reveals no gallop and no friction rub.   No murmur heard. Pulmonary/Chest: Effort normal. No respiratory distress. She has wheezes. She has no  rales. She exhibits no tenderness.  Decreased left lung   Abdominal: Soft. Bowel sounds are normal. She exhibits no distension and no mass. There is no tenderness. There is no rebound and no guarding.  Musculoskeletal: Normal range of motion. She exhibits no edema.  Neurological: She is alert and oriented to person, place, and time.  Skin: Skin is warm. No rash noted. No erythema.  Large chest wall mass  Psychiatric: Affect normal.      LABORATORY PANEL:   CBC  Recent Labs Lab 12/25/15 0312  WBC 11.7*  HGB 8.1*  HCT 26.1*  PLT 381   ------------------------------------------------------------------------------------------------------------------  Chemistries   Recent Labs Lab 01/16/2016 1125  12/25/15 0312  NA 135  --  139  K 6.3*  < > 4.0  CL 96*  --  101  CO2 15*  --  28  GLUCOSE 98  --  92  BUN 15  --  16  CREATININE 1.15*  --  0.54  CALCIUM 11.4*  --  10.4*  AST 45*  --   --   ALT 10*  --   --   ALKPHOS 86  --   --   BILITOT 0.6  --   --   < > = values in this interval not displayed. ------------------------------------------------------------------------------------------------------------------  Cardiac Enzymes  Recent Labs Lab 01/21/2016 1125  TROPONINI 0.04*   ------------------------------------------------------------------------------------------------------------------  RADIOLOGY:  Ct Chest W Contrast  01/13/2016  CLINICAL DATA:  Patient presents with shortness of breath. Patient is a resident at [facility]. She has not been feeling well for a week. she is on home oxygen for COPD. Apparently she had a syncopal episode today. Patient denies  chest pain. She reports cough and shortness of breath. Subjective fevers. No diaphoresis. No Calf pain or leg swelling. F/U chest xray. TKV. No hx ca EXAM: CT CHEST WITH CONTRAST TECHNIQUE: Multidetector CT imaging of the chest was performed during intravenous contrast administration. CONTRAST:  51m  ISOVUE-300 IOPAMIDOL (ISOVUE-300) INJECTION 61% COMPARISON:  Current chest radiograph.  Chest CT, 10/28/2010. FINDINGS: There is a large heterogeneous, enhancing mass in the left hemithorax that extends through the chest wall. This measures approximately 16 cm x 13 cm x 17.4 cm. The mass surrounds the left hilar structures occluding the left upper lobe bronchus and significantly narrowing the left upper lobe lingular segment pulmonary artery. Mass directly abuts the main and left pulmonary arteries and a left atrial appendage. It extends anteriorly and laterally through the chest wall, involving the anterior mediastinum. It has eroded through the majority of the sternum and involves the anterolateral first through fourth ribs. Portions of the mass extend just above the jugular notch and into the left axilla. No discrete mass or enlarged lymph node is seen in the neck base or axilla separate from the large mass. Mass also abuts the left pericardium and appears to invade portions of the pericardium adjacent to the left ventricle laterally and superiorly. There is a borderline right paratracheal lymph node measuring 9 mm in short axis. There are no right hilar masses or discrete enlarged lymph nodes. The The heart is mildly enlarged. Lungs and pleura: There are changes of parenchymal fibrosis in both lungs. There is opacity in portions of the left upper lobe consistent with compressive atelectasis. The oblique fissures depressed posteriorly by the large mass. No discrete lung mass or nodule is seen. No pleural effusion. Limited upper abdomen: There is a low-density area in the anterior aspect of the left liver lobe measuring 15 mm which may reflect a metastatic lesion. It could potentially reflect focal fat but was not evident on the prior CT. No other visualize liver lesions. No adrenal masses on the included field of view. Musculoskeletal: There is a severe compression fracture of T5 new since the prior CT and a  lateral chest radiograph dated 10/06/2010. Tumor again noted involving most of the sternum and the left first through fourth ribs. No other osteolytic lesions. IMPRESSION: 1. 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 Mass extends above the jugular notch and into the left axilla. 2. Single 15 mm low-density liver lesion in the left lobe suspicious for metastatic disease given chest findings. No adrenal masses noted on the included field of view. Electronically Signed   By: DLajean ManesM.D.   On: 01/17/2016 15:11     ASSESSMENT AND PLAN:   55year old female with a history of diabetes and COPD who presented with shortness of breath and found to have large left lung mass encroaching entire mediastinum with likely metastatic disease to the left lobe of the liver.  1. Acute hypoxic respiratory failure: This is due to lung mass with possible postobstructive pneumonia. Continue BiPAP and nebulizer treatment.  Supportive care Plan for terminal extubation later today  2. Sepsis presenting with tachycardia, tachypnea and leukocytosis: As is due to possible postobstructive pneumonia and Urinary tract infection: Continue Zosyn and follow up on  urine culture.  3. Large left lung mass: Very poor prognosis  4. Diabetes: Continue sliding scale insulin 5. Hypothyroid: Continue Synthroid  6. Schizoaffective disorder: Continue psych meds. 7. Rheumatoid arthritis on chronic prednisone and Plaquenil..  Plan for terminal extubation today  CODE STATUS: full  CRITICAL CARE TOTAL TIME TAKING CARE OF THIS PATIENT: 30 minutes.   Alric Seton M.D on 01/12/2016 at 1:45 PM  Between 7am to 6pm - Pager -  [phone] After 6pm go to [email or web address] - password [email or web address] [facility]  Office  [phone]  CC: Primary care physician; Lavera Guise, MD  Note: This dictation was prepared with Dragon dictation along with smaller phrase technology. Any transcriptional errors that result from this process are unintentional.

## 2016-01-23 NOTE — Progress Notes (Signed)
At 1830, patient ceased to have a pulse, heartbeat, or respirations.  Pt was pronounced deceased by dan Jessamine Barcia, RN and [employer], rn.  Family at bedside. elink aware.  Dr ram and dr patel aware.

## 2016-01-23 NOTE — Clinical Social Work Note (Signed)
Clinical Social Work Assessment  Patient Details  Name: Beth Hunter MRN: [id number] Date of Birth: [birth year]  Date of referral:  01/11/16               Reason for consult:  Facility Placement                Permission sought to share information with:  Family Supports, Customer service manager Permission granted to share information::  Yes, Verbal Permission Granted  Name::     Research scientist (life sciences), Janyce Llanos  and [employer] [facility]  Agency::  [employer] [facility].  Relationship::  yes  Contact Information:     Housing/Transportation Living arrangements for the past 2 months:  [place] of Information:  Adult Children Patient Interpreter Needed:  None Criminal Activity/Legal Involvement Pertinent to Current Situation/Hospitalization:  No - Comment as needed Significant Relationships:  Adult Children Lives with:  Facility Resident Do you feel safe going back to the place where you live?  Yes Need for family participation in patient care:  Yes (Comment)  Care giving concerns: Family initially wanted a second opinion and then was supported by LCSW and decided that wouldn't be in her best interest. In speaking with family all members agreed to comfort measures at this time.   Social Worker assessment / plan: LCSW met with patient and as patient was unable to speak decided to meet with her today. LCSW met with family and met all her sons Jennings Books, Sonia Baller and Niece Lahoma Rocker, several family members present. Provided emotional support to her family members. Patient has a large lung mass on left side ARF, hypertension, diabetes. She has been at [facility] for 14 years and needed a lot assistance with ADL's. Her sons remained active in her life and provide good family support. In consultation with ICU nurse family members to be present this afternoon and life support measures are to be discontinued at this time.  Employment status:  Disabled (Comment on whether or not currently  receiving Disability) [facility]) Insurance information:  Medicaid [place] PT Recommendations:  Not assessed at this time Information / Referral to community resources:     Patient/Family's Response to care: all family members visiting and made a decision to  Support and be with her at this time.  Patient/Family's Understanding of and Emotional Response to Diagnosis, Current Treatment, and Prognosis:  Patient on earlier visit acknowledged she needed help.  Emotional Assessment Appearance:  Appears stated age Attitude/Demeanor/Rapport:  Unable to Assess Affect (typically observed):  Unable to Assess Orientation:   x1 sedated Alcohol / Substance use:  Not Applicable Psych involvement (Current and /or in the community):  Yes (Comment) (SMI-treated )  Discharge Needs  Concerns to be addressed:  Care Coordination Readmission within the last 30 days:  No Current discharge risk:  None Barriers to Discharge:  Hospice Bed not available   Joana Reamer, LCSW 01/11/2016, 2:53 PM

## 2016-01-23 NOTE — Progress Notes (Signed)
Called [employer] and spoke with [employer].  Referral number is [id number] and pt is a full release to the funeral home.  Notified charge nurse, Retail buyer.

## 2016-01-23 DEATH — deceased
